# Patient Record
Sex: Female | Born: 1935 | Race: White | Hispanic: No | Smoking: Former smoker
Health system: Southern US, Community
[De-identification: ages and names within clinical notes are randomized; demographics above are authoritative.]

## PROBLEM LIST (undated history)

## (undated) DIAGNOSIS — K5730 Diverticulosis of large intestine without perforation or abscess without bleeding: Secondary | ICD-10-CM

## (undated) DIAGNOSIS — K589 Irritable bowel syndrome without diarrhea: Secondary | ICD-10-CM

## (undated) DIAGNOSIS — I10 Essential (primary) hypertension: Secondary | ICD-10-CM

## (undated) DIAGNOSIS — M1990 Unspecified osteoarthritis, unspecified site: Secondary | ICD-10-CM

## (undated) DIAGNOSIS — E785 Hyperlipidemia, unspecified: Secondary | ICD-10-CM

## (undated) HISTORY — PX: SHOULDER SURGERY: SHX246

## (undated) HISTORY — DX: Hyperlipidemia, unspecified: E78.5

## (undated) HISTORY — PX: BREAST SURGERY: SHX581

## (undated) HISTORY — PX: APPENDECTOMY: SHX54

## (undated) HISTORY — DX: Irritable bowel syndrome, unspecified: K58.9

## (undated) HISTORY — DX: Essential (primary) hypertension: I10

## (undated) HISTORY — PX: TONSILLECTOMY: SUR1361

## (undated) HISTORY — DX: Diverticulosis of large intestine without perforation or abscess without bleeding: K57.30

## (undated) HISTORY — DX: Unspecified osteoarthritis, unspecified site: M19.90

## (undated) HISTORY — PX: FOOT NEUROMA SURGERY: SHX646

## (undated) HISTORY — PX: BLADDER SURGERY: SHX569

## (undated) HISTORY — PX: ABDOMINAL HYSTERECTOMY: SHX81

---

## 1998-02-19 ENCOUNTER — Encounter: Payer: Self-pay | Admitting: Gastroenterology

## 1998-02-26 ENCOUNTER — Ambulatory Visit (HOSPITAL_COMMUNITY): Admission: RE | Admit: 1998-02-26 | Discharge: 1998-02-26 | Payer: Self-pay | Admitting: Gastroenterology

## 1999-12-23 LAB — HM DEXA SCAN: HM Dexa Scan: NORMAL

## 2002-08-24 HISTORY — PX: BREAST CYST EXCISION: SHX579

## 2004-06-28 ENCOUNTER — Ambulatory Visit: Payer: Self-pay | Admitting: Family Medicine

## 2004-08-12 ENCOUNTER — Ambulatory Visit: Payer: Self-pay | Admitting: Family Medicine

## 2004-08-25 ENCOUNTER — Ambulatory Visit: Payer: Self-pay | Admitting: Unknown Physician Specialty

## 2004-10-14 ENCOUNTER — Ambulatory Visit: Payer: Self-pay | Admitting: Unknown Physician Specialty

## 2004-11-24 ENCOUNTER — Ambulatory Visit: Payer: Self-pay | Admitting: Family Medicine

## 2004-12-09 ENCOUNTER — Ambulatory Visit: Payer: Self-pay | Admitting: Family Medicine

## 2005-02-03 ENCOUNTER — Ambulatory Visit: Payer: Self-pay | Admitting: Family Medicine

## 2005-02-17 ENCOUNTER — Ambulatory Visit: Payer: Self-pay | Admitting: Family Medicine

## 2005-04-06 ENCOUNTER — Ambulatory Visit: Payer: Self-pay | Admitting: Family Medicine

## 2005-04-13 ENCOUNTER — Ambulatory Visit: Payer: Self-pay | Admitting: Family Medicine

## 2005-05-19 ENCOUNTER — Ambulatory Visit: Payer: Self-pay | Admitting: Family Medicine

## 2005-07-04 ENCOUNTER — Ambulatory Visit: Payer: Self-pay | Admitting: Rheumatology

## 2005-07-07 ENCOUNTER — Ambulatory Visit: Payer: Self-pay | Admitting: Family Medicine

## 2005-10-13 ENCOUNTER — Other Ambulatory Visit: Payer: Self-pay

## 2005-10-13 ENCOUNTER — Ambulatory Visit: Payer: Self-pay | Admitting: General Practice

## 2005-10-18 ENCOUNTER — Ambulatory Visit: Payer: Self-pay | Admitting: General Practice

## 2005-10-31 ENCOUNTER — Encounter: Payer: Self-pay | Admitting: General Practice

## 2005-11-21 ENCOUNTER — Encounter: Payer: Self-pay | Admitting: General Practice

## 2005-12-22 ENCOUNTER — Encounter: Payer: Self-pay | Admitting: General Practice

## 2005-12-22 ENCOUNTER — Ambulatory Visit: Payer: Self-pay | Admitting: Family Medicine

## 2006-01-25 ENCOUNTER — Ambulatory Visit: Payer: Self-pay | Admitting: Family Medicine

## 2006-02-06 ENCOUNTER — Ambulatory Visit: Payer: Self-pay | Admitting: Family Medicine

## 2006-05-11 ENCOUNTER — Encounter: Payer: Self-pay | Admitting: Family Medicine

## 2006-05-11 ENCOUNTER — Ambulatory Visit: Payer: Self-pay | Admitting: Family Medicine

## 2006-05-11 ENCOUNTER — Other Ambulatory Visit: Admission: RE | Admit: 2006-05-11 | Discharge: 2006-05-11 | Payer: Self-pay | Admitting: Family Medicine

## 2006-06-01 ENCOUNTER — Ambulatory Visit: Payer: Self-pay | Admitting: Unknown Physician Specialty

## 2006-06-22 ENCOUNTER — Ambulatory Visit: Payer: Self-pay | Admitting: Family Medicine

## 2006-07-29 ENCOUNTER — Emergency Department: Payer: Self-pay | Admitting: Emergency Medicine

## 2006-08-14 ENCOUNTER — Ambulatory Visit: Payer: Self-pay | Admitting: Family Medicine

## 2006-08-14 LAB — CONVERTED CEMR LAB
Albumin: 3.5 g/dL (ref 3.5–5.2)
Alkaline Phosphatase: 55 units/L (ref 39–117)
HCT: 35.2 % — ABNORMAL LOW (ref 36.0–46.0)
Hemoglobin: 12.3 g/dL (ref 12.0–15.0)
Lymphocytes Relative: 17.7 % (ref 12.0–46.0)
MCHC: 34.9 g/dL (ref 30.0–36.0)
MCV: 88.3 fL (ref 78.0–100.0)
Monocytes Absolute: 0.9 10*3/uL — ABNORMAL HIGH (ref 0.2–0.7)
Monocytes Relative: 5.1 % (ref 3.0–11.0)
Neutro Abs: 14 10*3/uL — ABNORMAL HIGH (ref 1.4–7.7)
Neutrophils Relative %: 76 % (ref 43.0–77.0)
RDW: 11.9 % (ref 11.5–14.6)
Total Bilirubin: 0.9 mg/dL (ref 0.3–1.2)

## 2006-08-16 ENCOUNTER — Encounter: Payer: Self-pay | Admitting: Family Medicine

## 2006-09-14 ENCOUNTER — Ambulatory Visit: Payer: Self-pay | Admitting: Family Medicine

## 2006-09-14 LAB — CONVERTED CEMR LAB
ALT: 44 units/L — ABNORMAL HIGH (ref 0–40)
AST: 36 units/L (ref 0–37)
Cholesterol: 178 mg/dL (ref 0–200)
HDL: 41.8 mg/dL (ref >39.0)
LDL Cholesterol: 100 mg/dL — ABNORMAL HIGH (ref 0–99)
Total CHOL/HDL Ratio: 4.3
Triglycerides: 179 mg/dL — ABNORMAL HIGH (ref 0–149)
VLDL: 36 mg/dL (ref 0–40)

## 2006-09-17 ENCOUNTER — Ambulatory Visit: Payer: Self-pay | Admitting: Family Medicine

## 2006-09-17 LAB — CONVERTED CEMR LAB
Basophils Absolute: 0 10*3/uL (ref 0.0–0.1)
Eosinophils Absolute: 0.2 10*3/uL (ref 0.0–0.6)
Eosinophils Relative: 2.3 % (ref 0.0–5.0)
HCT: 34.5 % — ABNORMAL LOW (ref 36.0–46.0)
Lymphocytes Relative: 34.6 % (ref 12.0–46.0)
MCHC: 34.1 g/dL (ref 30.0–36.0)
MCV: 89.2 fL (ref 78.0–100.0)
Neutro Abs: 4.8 10*3/uL (ref 1.4–7.7)
Neutrophils Relative %: 58.1 % (ref 43.0–77.0)
RBC: 3.87 M/uL (ref 3.87–5.11)
WBC: 8.2 10*3/uL (ref 4.5–10.5)

## 2007-01-18 ENCOUNTER — Telehealth (INDEPENDENT_AMBULATORY_CARE_PROVIDER_SITE_OTHER): Payer: Self-pay | Admitting: *Deleted

## 2007-01-18 ENCOUNTER — Telehealth: Payer: Self-pay | Admitting: Family Medicine

## 2007-01-22 ENCOUNTER — Telehealth: Payer: Self-pay | Admitting: Family Medicine

## 2007-01-22 ENCOUNTER — Encounter: Payer: Self-pay | Admitting: Family Medicine

## 2007-01-22 DIAGNOSIS — A0472 Enterocolitis due to Clostridium difficile, not specified as recurrent: Secondary | ICD-10-CM

## 2007-01-22 DIAGNOSIS — M519 Unspecified thoracic, thoracolumbar and lumbosacral intervertebral disc disorder: Secondary | ICD-10-CM | POA: Insufficient documentation

## 2007-01-22 DIAGNOSIS — K222 Esophageal obstruction: Secondary | ICD-10-CM

## 2007-01-22 DIAGNOSIS — K589 Irritable bowel syndrome without diarrhea: Secondary | ICD-10-CM | POA: Insufficient documentation

## 2007-01-28 ENCOUNTER — Telehealth: Payer: Self-pay | Admitting: Family Medicine

## 2007-02-01 ENCOUNTER — Ambulatory Visit: Payer: Self-pay | Admitting: Family Medicine

## 2007-02-01 DIAGNOSIS — I10 Essential (primary) hypertension: Secondary | ICD-10-CM

## 2007-02-01 DIAGNOSIS — E7800 Pure hypercholesterolemia, unspecified: Secondary | ICD-10-CM | POA: Insufficient documentation

## 2007-04-05 ENCOUNTER — Ambulatory Visit: Payer: Self-pay | Admitting: Family Medicine

## 2007-04-08 LAB — CONVERTED CEMR LAB
ALT: 17 units/L (ref 0–35)
Basophils Relative: 0.1 % (ref 0.0–1.0)
Eosinophils Relative: 1.7 % (ref 0.0–5.0)
HCT: 36.8 % (ref 36.0–46.0)
Hemoglobin: 12.8 g/dL (ref 12.0–15.0)
LDL Cholesterol: 106 mg/dL — ABNORMAL HIGH (ref 0–99)
Lymphocytes Relative: 33.3 % (ref 12.0–46.0)
Monocytes Absolute: 0.4 10*3/uL (ref 0.2–0.7)
Neutro Abs: 4.4 10*3/uL (ref 1.4–7.7)
Neutrophils Relative %: 59.5 % (ref 43.0–77.0)
RDW: 11.7 % (ref 11.5–14.6)
VLDL: 21 mg/dL (ref 0–40)
WBC: 7.4 10*3/uL (ref 4.5–10.5)

## 2007-07-22 ENCOUNTER — Ambulatory Visit: Payer: Self-pay | Admitting: Family Medicine

## 2007-07-26 ENCOUNTER — Telehealth: Payer: Self-pay | Admitting: Family Medicine

## 2007-07-26 ENCOUNTER — Ambulatory Visit: Payer: Self-pay | Admitting: Family Medicine

## 2007-08-02 ENCOUNTER — Ambulatory Visit: Payer: Self-pay | Admitting: Family Medicine

## 2007-08-05 LAB — CONVERTED CEMR LAB
Basophils Absolute: 0 10*3/uL (ref 0.0–0.1)
HCT: 35.9 % — ABNORMAL LOW (ref 36.0–46.0)
Hemoglobin: 12.3 g/dL (ref 12.0–15.0)
Lymphocytes Relative: 19.5 % (ref 12.0–46.0)
MCHC: 34.2 g/dL (ref 30.0–36.0)
MCV: 92.2 fL (ref 78.0–100.0)
Monocytes Absolute: 0.7 10*3/uL (ref 0.2–0.7)
Neutro Abs: 8.2 10*3/uL — ABNORMAL HIGH (ref 1.4–7.7)
Neutrophils Relative %: 72.6 % (ref 43.0–77.0)
RDW: 11.4 % — ABNORMAL LOW (ref 11.5–14.6)

## 2007-08-09 ENCOUNTER — Ambulatory Visit: Payer: Self-pay | Admitting: Family Medicine

## 2007-09-06 ENCOUNTER — Encounter: Payer: Self-pay | Admitting: Family Medicine

## 2007-09-06 ENCOUNTER — Ambulatory Visit: Payer: Self-pay | Admitting: Family Medicine

## 2007-11-07 ENCOUNTER — Ambulatory Visit: Payer: Self-pay | Admitting: Family Medicine

## 2007-12-02 ENCOUNTER — Telehealth: Payer: Self-pay | Admitting: Family Medicine

## 2008-01-27 ENCOUNTER — Telehealth: Payer: Self-pay | Admitting: Family Medicine

## 2008-01-29 ENCOUNTER — Telehealth: Payer: Self-pay | Admitting: Family Medicine

## 2008-04-24 ENCOUNTER — Ambulatory Visit: Payer: Self-pay | Admitting: Family Medicine

## 2008-04-28 LAB — CONVERTED CEMR LAB
AST: 25 units/L (ref 0–37)
Albumin: 3.9 g/dL (ref 3.5–5.2)
Alkaline Phosphatase: 56 units/L (ref 39–117)
Basophils Relative: 0.2 % (ref 0.0–3.0)
Calcium: 9.2 mg/dL (ref 8.4–10.5)
Creatinine, Ser: 1 mg/dL (ref 0.4–1.2)
Eosinophils Relative: 1.8 % (ref 0.0–5.0)
GFR calc Af Amer: 69 mL/min
GFR calc non Af Amer: 57 mL/min
Hemoglobin: 12.4 g/dL (ref 12.0–15.0)
Lymphocytes Relative: 36.1 % (ref 12.0–46.0)
MCV: 93.3 fL (ref 78.0–100.0)
Neutrophils Relative %: 55 % (ref 43.0–77.0)
Phosphorus: 3.6 mg/dL (ref 2.3–4.6)
RBC: 3.82 M/uL — ABNORMAL LOW (ref 3.87–5.11)
Total Protein: 6.9 g/dL (ref 6.0–8.3)
VLDL: 22 mg/dL (ref 0–40)
WBC: 5.8 10*3/uL (ref 4.5–10.5)

## 2008-05-08 ENCOUNTER — Ambulatory Visit: Payer: Self-pay | Admitting: Family Medicine

## 2008-07-09 ENCOUNTER — Encounter: Payer: Self-pay | Admitting: Family Medicine

## 2008-07-22 ENCOUNTER — Ambulatory Visit: Payer: Self-pay | Admitting: Family Medicine

## 2008-10-08 ENCOUNTER — Telehealth: Payer: Self-pay | Admitting: Family Medicine

## 2008-10-15 ENCOUNTER — Encounter: Payer: Self-pay | Admitting: Family Medicine

## 2008-10-21 ENCOUNTER — Encounter: Payer: Self-pay | Admitting: Family Medicine

## 2008-10-21 ENCOUNTER — Ambulatory Visit: Payer: Self-pay | Admitting: General Surgery

## 2008-10-28 ENCOUNTER — Ambulatory Visit: Payer: Self-pay | Admitting: Family Medicine

## 2008-10-28 ENCOUNTER — Telehealth: Payer: Self-pay | Admitting: Family Medicine

## 2008-10-28 LAB — CONVERTED CEMR LAB
Nitrite: NEGATIVE
RBC / HPF: 0
Specific Gravity, Urine: 1.015
Urine crystals, microscopic: 0 /hpf
Yeast, UA: 0
pH: 5

## 2008-11-05 ENCOUNTER — Encounter: Payer: Self-pay | Admitting: Family Medicine

## 2008-11-06 ENCOUNTER — Encounter: Payer: Self-pay | Admitting: Family Medicine

## 2008-11-19 ENCOUNTER — Ambulatory Visit: Payer: Self-pay | Admitting: Family Medicine

## 2008-11-19 ENCOUNTER — Encounter: Payer: Self-pay | Admitting: Family Medicine

## 2008-11-19 LAB — HM MAMMOGRAPHY: HM Mammogram: NEGATIVE

## 2008-11-23 ENCOUNTER — Encounter (INDEPENDENT_AMBULATORY_CARE_PROVIDER_SITE_OTHER): Payer: Self-pay | Admitting: *Deleted

## 2008-12-10 ENCOUNTER — Ambulatory Visit: Payer: Self-pay | Admitting: Gastroenterology

## 2008-12-10 DIAGNOSIS — R933 Abnormal findings on diagnostic imaging of other parts of digestive tract: Secondary | ICD-10-CM

## 2008-12-21 ENCOUNTER — Encounter: Payer: Self-pay | Admitting: Family Medicine

## 2009-02-17 ENCOUNTER — Encounter: Payer: Self-pay | Admitting: Family Medicine

## 2009-02-24 ENCOUNTER — Telehealth: Payer: Self-pay | Admitting: Family Medicine

## 2009-03-02 ENCOUNTER — Telehealth: Payer: Self-pay | Admitting: Family Medicine

## 2009-03-18 ENCOUNTER — Ambulatory Visit: Payer: Self-pay | Admitting: Unknown Physician Specialty

## 2009-03-18 ENCOUNTER — Encounter: Payer: Self-pay | Admitting: Family Medicine

## 2009-03-18 LAB — HM COLONOSCOPY

## 2009-04-24 ENCOUNTER — Encounter: Payer: Self-pay | Admitting: Family Medicine

## 2009-09-14 ENCOUNTER — Ambulatory Visit: Payer: Self-pay | Admitting: Family Medicine

## 2009-09-22 ENCOUNTER — Telehealth: Payer: Self-pay | Admitting: Family Medicine

## 2009-09-24 ENCOUNTER — Ambulatory Visit: Payer: Self-pay | Admitting: Family Medicine

## 2009-09-24 ENCOUNTER — Telehealth: Payer: Self-pay | Admitting: Family Medicine

## 2009-09-29 ENCOUNTER — Ambulatory Visit: Payer: Self-pay | Admitting: Family Medicine

## 2009-09-29 DIAGNOSIS — M545 Low back pain: Secondary | ICD-10-CM

## 2009-10-01 ENCOUNTER — Encounter: Payer: Self-pay | Admitting: Family Medicine

## 2009-10-01 ENCOUNTER — Ambulatory Visit: Payer: Self-pay | Admitting: Family Medicine

## 2009-10-04 ENCOUNTER — Telehealth: Payer: Self-pay | Admitting: Family Medicine

## 2009-10-05 ENCOUNTER — Emergency Department: Payer: Self-pay | Admitting: Emergency Medicine

## 2009-11-08 ENCOUNTER — Telehealth: Payer: Self-pay | Admitting: Family Medicine

## 2009-11-23 ENCOUNTER — Telehealth: Payer: Self-pay | Admitting: Family Medicine

## 2010-03-18 ENCOUNTER — Telehealth: Payer: Self-pay | Admitting: Family Medicine

## 2010-04-26 ENCOUNTER — Telehealth: Payer: Self-pay | Admitting: Family Medicine

## 2010-04-27 ENCOUNTER — Ambulatory Visit: Payer: Self-pay | Admitting: Family Medicine

## 2010-05-30 ENCOUNTER — Telehealth: Payer: Self-pay | Admitting: Family Medicine

## 2010-07-15 ENCOUNTER — Telehealth: Payer: Self-pay | Admitting: Family Medicine

## 2010-08-20 ENCOUNTER — Ambulatory Visit
Admission: RE | Admit: 2010-08-20 | Discharge: 2010-08-20 | Payer: Self-pay | Source: Home / Self Care | Attending: Family Medicine | Admitting: Family Medicine

## 2010-08-20 DIAGNOSIS — J019 Acute sinusitis, unspecified: Secondary | ICD-10-CM | POA: Insufficient documentation

## 2010-08-25 NOTE — Assessment & Plan Note (Signed)
Summary: FLU VACCINE/ TOWER  Nurse Visit   Allergies: 1)  ! Zithromax 2)  ! Levaquin 3)  ! Biaxin 4)  Norvasc (Amlodipine Besylate) 5)  Feldene (Piroxicam) 6)  Lipitor (Atorvastatin Calcium) 7)  Advil (Ibuprofen)  Orders Added: 1)  Flu Vaccine 71yrs + MEDICARE PATIENTS [Q2039] 2)  Administration Flu vaccine - MCR [G0008]    Flu Vaccine Consent Questions     Do you have a history of severe allergic reactions to this vaccine? no    Any prior history of allergic reactions to egg and/or gelatin? no    Do you have a sensitivity to the preservative Thimersol? no    Do you have a past history of Guillan-Barre Syndrome? no    Do you currently have an acute febrile illness? no    Have you ever had a severe reaction to latex? no    Vaccine information given and explained to patient? yes    Are you currently pregnant? no    Lot Number:[id number]   Exp Date:01/21/2011   Site Given  Left Deltoid IMlu

## 2010-08-25 NOTE — Progress Notes (Signed)
Summary: does pt need pneumovax?  Phone Note Call from Patient Call back at Home Phone [phone]   Caller: Patient Call For: Judith Part MD Summary of Call: Pt was at [facility] in march with pneumonia.  She is asking if you think she should get another pneumovax, she had one in 2002. Initial call taken by: Lowella Petties CMA,  April 26, 2010 8:16 AM  Follow-up for Phone Call        the general guidelines are that after age 75 the last pneumovax is the LAST one --since she got it at approx age 40 we would not give it  Follow-up by: Judith Part MD,  April 26, 2010 9:28 AM  Additional Follow-up for Phone Call Additional follow up Details #1::        Patient notified as instructed by telephone. Lewanda Rife LPN  April 26, 2010 10:54 AM

## 2010-08-25 NOTE — Progress Notes (Signed)
Summary: Hydrocodone/APAP 5-500  Phone Note Refill Request Call back at [phone] Message from:  [facility] [address] on July 15, 2010 4:21 PM  Refills Requested: Medication #1:  VICODIN 5-500 MG TABS 1 by mouth up to every 4-6 hours as needed back pain  watch for sedation.   Last Refilled: 05/30/2010 [facility] [address] electronically requested refill for Hydrocodone/APAP 5-500 Last refill date 05/30/10. Please advise.    Method Requested: Telephone to Pharmacy Initial call taken by: Lewanda Rife LPN,  July 15, 2010 4:22 PM  Follow-up for Phone Call        px written on EMR for call in  Follow-up by: Judith Part MD,  July 15, 2010 4:45 PM  Additional Follow-up for Phone Call Additional follow up Details #1::        Medication phoned to [facility] [address]  pharmacy as instructed. Lewanda Rife LPN  July 15, 2010 4:53 PM     Prescriptions: VICODIN 5-500 MG TABS (HYDROCODONE-ACETAMINOPHEN) 1 by mouth up to every 4-6 hours as needed back pain  watch for sedation  #30 x 0   Entered and Authorized by:   Judith Part MD   Signed by:   Lewanda Rife LPN on 78/46/9629   Method used:   Telephoned to ...         RxID:   [id number]

## 2010-08-25 NOTE — Progress Notes (Signed)
Summary: Pt update  Phone Note Call from Patient   Caller: Patient Summary of Call: Patient calling to get the xray results from last Friday when she went to [facility] on 10/01/2009 , she is still hurting but cant take pain meds too good. she is at her work right now. You can call her with results at [phone], she goes by Rachel Knox at her job.  Initial call taken by: Carlton Adam,  October 04, 2009 2:43 PM  Follow-up for Phone Call        let her know there is some mild degenerative change in spine and disk - no fractures  I want to refer her to ortho since she is hurting so much let me know if agreeable and I will do ref  Follow-up by: Judith Part MD,  October 04, 2009 5:11 PM  Additional Follow-up for Phone Call Additional follow up Details #1::        Patient notified as instructed by telephone. Pt said she is not hurting as much today. Let her think about the ortho referral and she will let you know.Lewanda Rife LPN  October 04, 2009 5:28 PM

## 2010-08-25 NOTE — Progress Notes (Signed)
Summary: Rx Vicodin  Phone Note Refill Request Call back at [phone] [facility] from:  [facility]  Refills Requested: Medication #1:  VICODIN 5-500 MG TABS 1 by mouth up to every 4-6 hours as needed back pain  watch for sedation.   Last Refilled: 09/30/2009  Method Requested: Telephone to Pharmacy Initial call taken by: Sydell Axon LPN,  May 30, 2010 9:54 AM  Follow-up for Phone Call        px written on EMR for call in  Follow-up by: Judith Part MD,  May 30, 2010 10:18 AM  Additional Follow-up for Phone Call Additional follow up Details #1::        Medication phoned to [facility] pharmacy as instructed. Lewanda Rife LPN  May 30, 2010 10:41 AM     New/Updated Medications: VICODIN 5-500 MG TABS (HYDROCODONE-ACETAMINOPHEN) 1 by mouth up to every 4-6 hours as needed back pain  watch for sedation Prescriptions: VICODIN 5-500 MG TABS (HYDROCODONE-ACETAMINOPHEN) 1 by mouth up to every 4-6 hours as needed back pain  watch for sedation  #30 x 0   Entered and Authorized by:   Judith Part MD   Signed by:   Lewanda Rife LPN on 45/40/9811   Method used:   Telephoned to ...         RxID:   [id number]

## 2010-08-25 NOTE — Progress Notes (Signed)
Summary: pt needs written scripts for mail order  Phone Note Refill Request Message from:  Patient  Refills Requested: Medication #1:  HYDROCHLOROTHIAZIDE 25 MG  TABS 1 by mouth once daily  Medication #2:  DIOVAN HCT 160-12.5 MG  TABS 1 by mouth once daily  Medication #3:  METOPROLOL SUCCINATE 50 MG XR24H-TAB 1 by mouth once daily  Medication #4:  OMEPRAZOLE 20 MG TBEC Take 1 tablet by mouth once a day Also allegra.  Pt wants written scripts to send to mail order, please call when ready.  Initial call taken by: Lowella Petties CMA,  November 08, 2009 2:22 PM  Follow-up for Phone Call        ok to refill all of these, 90 days with 3 refills. Follow-up by: Ruthe Mannan MD,  November 08, 2009 2:27 PM    Prescriptions: METOPROLOL SUCCINATE 50 MG XR24H-TAB (METOPROLOL SUCCINATE) 1 by mouth once daily  #90 x 3   Entered by:   Delilah Shan CMA (AAMA)   Authorized by:   Ruthe Mannan MD   Signed by:   Delilah Shan CMA (AAMA) on 11/08/2009   Method used:   Electronically to        [facility]* (mail-order)       [address]       [place], [place]  [address]       Ph: [phone]       Fax: [phone]   RxID:   [id number] OMEPRAZOLE 20 MG TBEC (OMEPRAZOLE) Take 1 tablet by mouth once a day  #90 x 3   Entered by:   Delilah Shan CMA (AAMA)   Authorized by:   Ruthe Mannan MD   Signed by:   Delilah Shan CMA (AAMA) on 11/08/2009   Method used:   Electronically to        [facility] (mail-order)       [address]       [place], [place]  [address]       Ph: [phone]       Fax: [phone]   RxID:   [id number] ALLEGRA 180 MG  TABS (FEXOFENADINE HCL) 1 by mouth once daily as needed  #90 x 3   Entered by:   Delilah Shan CMA (AAMA)   Authorized by:   Ruthe Mannan MD   Signed by:   Delilah Shan CMA (AAMA) on 11/08/2009   Method used:   Electronically to        [facility] (mail-order)       [address]  [place], [place]  [address]       Ph: [phone]       Fax: [phone]   RxID:   [id number] HYDROCHLOROTHIAZIDE 25 MG  TABS (HYDROCHLOROTHIAZIDE) 1 by mouth once daily  #90 x 3   Entered by:   Delilah Shan CMA (AAMA)   Authorized by:   Ruthe Mannan MD   Signed by:   Delilah Shan CMA (AAMA) on 11/08/2009   Method used:   Electronically to        [facility]* (mail-order)       [address]       [place], [place]  [address]       Ph: [phone]       Fax: [phone]   RxID:   [id number] DIOVAN HCT 160-12.5 MG  TABS (VALSARTAN-HYDROCHLOROTHIAZIDE) 1 by mouth once daily  #90 x 3   Entered by:  Lugene Fuquay CMA (AAMA)   Authorized by:   Ruthe Mannan MD   Signed by:   Delilah Shan CMA (AAMA) on 11/08/2009   Method used:   Electronically to        [facility]* (mail-order)       [address]       [place], [place]  [address]       Ph: [phone]       Fax: [phone]   RxID:   [id number]

## 2010-08-25 NOTE — Miscellaneous (Signed)
[facility]   Imported By: Lanelle Bal 09/17/2009 09:06:27  _____________________________________________________________________  External Attachment:    Type:   Image     Comment:   External Document

## 2010-08-25 NOTE — Assessment & Plan Note (Signed)
Summary: MID BACK PAIN   Vital Signs:  Patient profile:   77 year old female Height:      64 inches Weight:      133 pounds BMI:     22.91 Temp:     98.1 degrees F oral Pulse rate:   72 / minute Pulse rhythm:   regular BP sitting:   110 / 70  (left arm) Cuff size:   regular  Vitals Entered By: Lewanda Rife LPN (September 30, 5619 3:59 PM)  History of Present Illness: still having cough but not as frequent   is not tolerating the abx -well - it gives her diarrhea -- has really bad  has 3 more days -- biaxin  has been on 7 days - can just stop   back is really hurting  between middle and low back -- a burning pain is just in the middle - not worse on one side or the other  has had for a few years -- told she has deg disc dz   pain is not radiating to her legs or arms  no numbness or weakness  is more painful to bend foward than back  twisting does not hurt  it helps a bit to stand up very straight   took her brother's percocet - and it really helped  has been taking some tylenol and that is not helping much  no nsaids -- cannot take advil   flexeril helps her sleep but not much with pain   Allergies: 1)  ! Zithromax 2)  ! Levaquin 3)  ! Biaxin 4)  Norvasc (Amlodipine Besylate) 5)  Feldene (Piroxicam) 6)  Lipitor (Atorvastatin Calcium) 7)  Advil (Ibuprofen)  Past History:  Past Medical History: Last updated: 05/08/2008 Diverticulosis, colon Hyperlipidemia Hypertension Osteoarthritis Urinary incontinence IBS hx of c difficile in past - treated   Past Surgical History: Last updated: 03/31/2009 Appendectomy- with infection (surgery times 2) Hysterectomy Tonsillectomy Bladder tack Breast cyst needle biopsy Left foot surgery- morton's neuroma Dexa- normal (12/1999) EGD- dilated esoph stricture (1997) EGD- dilated stricture, gastritis (08/2002) Breast discharge, cyst removed (08/2002) Colonoscopy- diverticulosis, hemorrhoids (1999) Colonoscopy  (09/2004) Shoulder surgery- spur EGD, BE- neg (1997, 1998) CT abd/ pelvis (12/1996) CT abd- neg (05/2006), small hiatal hernia 9/10 colonoscopy diverticulosis   Family History: Last updated: Feb 04, 2007 Father: deceased age 61- MI Mother: deceased age 27- kidney cancer Siblings: 1 brother, 3 sisters sister MI, lung cancer  Social History: Last updated: 12/10/2008 Marital Status:  Children:  Occupation:  remote hx of brief light smoking hx of second hand smoke exp Alcohol Use - no Daily Caffeine Use 1 per day Illicit Drug Use - no Patient gets regular exercise.  Risk Factors: Exercise: yes (12/10/2008)  Risk Factors: Smoking Status: never (01/22/2007)  Review of Systems General:  Denies fatigue, fever, loss of appetite, and malaise. Eyes:  Denies blurring. ENT:  Complains of postnasal drainage. CV:  Denies chest pain or discomfort and palpitations. Resp:  Complains of cough; denies shortness of breath, sputum productive, and wheezing. GI:  Complains of diarrhea; denies nausea and vomiting. GU:  Denies dysuria, hematuria, and urinary frequency. MS:  Complains of low back pain, mid back pain, and stiffness; denies cramps and muscle weakness. Derm:  Denies lesion(s), poor wound healing, and rash. Neuro:  Denies numbness, tingling, and weakness. Endo:  Denies excessive thirst and excessive urination. Heme:  Denies abnormal bruising and bleeding.  Physical Exam  General:  Well-developed,well-nourished,in no acute distress; alert,appropriate and cooperative  throughout examination Head:  normocephalic, atraumatic, and no abnormalities observed.   Eyes:  vision grossly intact, pupils equal, pupils round, and pupils reactive to light.  no conjunctival pallor, injection or icterus  Mouth:  pharynx pink and moist.   Neck:  nl rom no bony tendernes no LN Chest Wall:  No deformities, masses, or tenderness noted. Lungs:  CTA with harsh bs at bases no rales or rhonchi  Heart:   Normal rate and regular rhythm. S1 and S2 normal without gallop, murmur, click, rub or other extra sounds. Abdomen:  Bowel sounds positive,abdomen soft and non-tender without masses, organomegaly or hernias noted. Msk:  tender lower TS and upper LS  pain in perispinal musculature bilat - but not SI joints  flex 90 deg with pain and ext 20 deg (helps a bit)  some pain on L flex nl twist  nl slr  nl rom hips nl gait  Pulses:  R and L carotid,radial,femoral,dorsalis pedis and posterior tibial pulses are full and equal bilaterally Extremities:  No clubbing, cyanosis, edema, or deformity noted with normal full range of motion of all joints.   Neurologic:  strength normal in all extremities, sensation intact to light touch, gait normal, and DTRs symmetrical and normal.   Skin:  Intact without suspicious lesions or rashes Cervical Nodes:  No lymphadenopathy noted Inguinal Nodes:  No significant adenopathy Psych:  normal affect, talkative and pleasant    Impression & Recommendations:  Problem # 1:  BACK PAIN (ICD-724.5) Assessment New thoracolumbar- positional with some spinal tenderness but no neurol s/s check x ray vidocin for pain since cannot take nsaids  plan based on film result  Her updated medication list for this problem includes:    Flexeril 10 Mg Tabs (Cyclobenzaprine hcl) .Marland Kitchen... Take 1 tablet by mouth two times a day as needed    Vicodin 5-500 Mg Tabs (Hydrocodone-acetaminophen) .Marland Kitchen... 1 by mouth up to every 4-6 hours as needed back pain  watch for sedation  Orders: Radiology Referral (Radiology)  Problem # 2:  BRONCHITIS- ACUTE (ICD-466.0) Assessment: Improved gradual imp will not finish biaxin due to diarrhea and fact she is much imp  adv to please update if symptoms return or do not imp  hx of c diff- so esp update if diarrhea does not imp further  The following medications were removed from the medication list:    Biaxin Xl 500 Mg Xr24h-tab (Clarithromycin) .Marland Kitchen... 2 by  mouth once daily for 10 days  take with a meal Her updated medication list for this problem includes:    Hydrocodone-homatropine 5-1.5 Mg/4ml Syrp (Hydrocodone-homatropine) .Marland Kitchen... 1 teapoon by mouth up to every 6 hours as needed severe cough  Complete Medication List: 1)  Flexeril 10 Mg Tabs (Cyclobenzaprine hcl) .... Take 1 tablet by mouth two times a day as needed 2)  Diovan Hct 160-12.5 Mg Tabs (Valsartan-hydrochlorothiazide) .Marland Kitchen.. 1 by mouth once daily 3)  Hydrochlorothiazide 25 Mg Tabs (Hydrochlorothiazide) .Marland Kitchen.. 1 by mouth once daily 4)  Zocor 20 Mg Tabs (Simvastatin) .... Take 1 tablet by mouth once a day 5)  Allegra 180 Mg Tabs (Fexofenadine hcl) .Marland Kitchen.. 1 by mouth once daily as needed 6)  Omeprazole 20 Mg Tbec (Omeprazole) .... Take 1 tablet by mouth once a day 7)  Metoprolol Succinate 50 Mg Xr24h-tab (Metoprolol succinate) .Marland Kitchen.. 1 by mouth once daily 8)  Hydrocodone-homatropine 5-1.5 Mg/45ml Syrp (Hydrocodone-homatropine) .Marland Kitchen.. 1 teapoon by mouth up to every 6 hours as needed severe cough 9)  Vicodin 5-500 Mg Tabs (  Hydrocodone-acetaminophen) .Marland Kitchen.. 1 by mouth up to every 4-6 hours as needed back pain  watch for sedation  Patient Instructions: 1)  try some heat on your back - or warm bath  2)  we will set up x rays at check out  3)  try the vicodin for pain -- do not mix with the flexeril - it is sedating  4)  I will update you with a plan when x rays return Prescriptions: VICODIN 5-500 MG TABS (HYDROCODONE-ACETAMINOPHEN) 1 by mouth up to every 4-6 hours as needed back pain  watch for sedation  #30 x 0   Entered and Authorized by:   Judith Part MD   Signed by:   Judith Part MD on 09/29/2009   Method used:   Print then Give to Patient   RxID:   [id number]   Current Allergies (reviewed today): ! ZITHROMAX ! LEVAQUIN ! BIAXIN NORVASC (AMLODIPINE BESYLATE) FELDENE (PIROXICAM) LIPITOR (ATORVASTATIN CALCIUM) ADVIL (IBUPROFEN)

## 2010-08-25 NOTE — Progress Notes (Signed)
Summary: Cyclobenzaprine 10mg  rx  Phone Note Refill Request Call back at [phone] Message from:  [facility] [address] on March 18, 2010 1:01 PM  Refills Requested: Medication #1:  FLEXERIL 10 MG TABS Take 1 tablet by mouth two times a day as needed   Last Refilled: 02/20/2010 [facility] [address] electronically request refill for Cyclobenzaprine 10mg  taking 1/2 to 1 by mouth three times daily as needed. Medlist list one tablet by mouth twice a day as needed.Please advise.    Method Requested: Telephone to Pharmacy Initial call taken by: Lewanda Rife LPN,  March 18, 2010 1:02 PM  Follow-up for Phone Call        px written on EMR for call in  Follow-up by: Judith Part MD,  March 18, 2010 1:17 PM    New/Updated Medications: FLEXERIL 10 MG TABS (CYCLOBENZAPRINE HCL) Take 1/2 to 1 by mouth up to three times a day as needed Prescriptions: FLEXERIL 10 MG TABS (CYCLOBENZAPRINE HCL) Take 1/2 to 1 by mouth up to three times a day as needed  #30 x 1   Entered by:   Linde Gillis CMA (AAMA)   Authorized by:   Judith Part MD   Signed by:   Linde Gillis CMA (AAMA) on 03/18/2010   Method used:   [facility]ically to        [facility] [address])       [address], [address]       [place], [place]  [address]       Ph: [phone]       Fax: [phone]   RxID:   [id number]

## 2010-08-25 NOTE — Assessment & Plan Note (Signed)
Summary: COUGH CONGESTED PER DR TOWER/RI   Vital Signs:  Patient profile:   77 year old female Height:      64 inches Weight:      130.75 pounds BMI:     22.52 O2 Sat:      93 % on Room air Temp:     97.7 degrees F oral Pulse rate:   80 / minute Pulse rhythm:   regular Resp:     28 per minute BP sitting:   124 / 60  (left arm) Cuff size:   regular  Vitals Entered By: Lewanda Rife LPN (September 25, 1322 8:59 AM)  O2 Flow:  Room air  History of Present Illness: never got better from last uri -- was tx with augmentin  dry cough now -- is rattly - but not bringing anything up  is white if she gets a little up   is wheeze at night just a little   fever is on and off -- is ? how high gets dry lips  some chils and aches  some runny and stuffy nose  no headache       Allergies: 1)  ! Zithromax 2)  ! Levaquin 3)  Norvasc (Amlodipine Besylate) 4)  Feldene (Piroxicam) 5)  Lipitor (Atorvastatin Calcium) 6)  Advil (Ibuprofen)  Past History:  Past Medical History: Last updated: 05/08/2008 Diverticulosis, colon Hyperlipidemia Hypertension Osteoarthritis Urinary incontinence IBS hx of c difficile in past - treated   Past Surgical History: Last updated: 03/31/2009 Appendectomy- with infection (surgery times 2) Hysterectomy Tonsillectomy Bladder tack Breast cyst needle biopsy Left foot surgery- morton's neuroma Dexa- normal (12/1999) EGD- dilated esoph stricture (1997) EGD- dilated stricture, gastritis (08/2002) Breast discharge, cyst removed (08/2002) Colonoscopy- diverticulosis, hemorrhoids (1999) Colonoscopy (09/2004) Shoulder surgery- spur EGD, BE- neg (1997, 1998) CT abd/ pelvis (12/1996) CT abd- neg (05/2006), small hiatal hernia 9/10 colonoscopy diverticulosis   Family History: Last updated: 02-08-07 Father: deceased age 73- MI Mother: deceased age 53- kidney cancer Siblings: 1 brother, 3 sisters sister MI, lung cancer  Social History: Last  updated: 12/10/2008 Marital Status:  Children:  Occupation:  remote hx of brief light smoking hx of second hand smoke exp Alcohol Use - no Daily Caffeine Use 1 per day Illicit Drug Use - no Patient gets regular exercise.  Risk Factors: Exercise: yes (12/10/2008)  Risk Factors: Smoking Status: never (01/22/2007)  Review of Systems General:  Complains of chills, fatigue, fever, loss of appetite, and malaise. Eyes:  Denies blurring, discharge, and eye irritation. ENT:  Complains of nasal congestion, postnasal drainage, and sore throat; denies earache. CV:  Denies chest pain or discomfort and palpitations. Resp:  Complains of cough and sputum productive; denies pleuritic and shortness of breath. GI:  Denies abdominal pain, diarrhea, nausea, and vomiting. Derm:  Denies rash.  Physical Exam  General:  Well-developed,well-nourished,in no acute distress; alert,appropriate and cooperative throughout examination Head:  normocephalic, atraumatic, and no abnormalities observed.  no sinus tenderness  Eyes:  vision grossly intact, pupils equal, pupils round, pupils reactive to light, and no injection.   Ears:  R ear normal and L ear normal.   Nose:  nares are boggy with clear rhinorrhea  Mouth:  pharynx pink and moist, no erythema, and no exudates.   Neck:  No deformities, masses, or tenderness noted. Lungs:  harsh bs at bases with fair air exch and some rhonchi no wheeze/ or rales or crackles cough sounds moist- however no sob Heart:  Normal rate and regular  rhythm. S1 and S2 normal without gallop, murmur, click, rub or other extra sounds. Abdomen:  soft and non-tender.   Skin:  Intact without suspicious lesions or rashes Cervical Nodes:  No lymphadenopathy noted Psych:  normal affect, talkative and pleasant    Impression & Recommendations:  Problem # 1:  BRONCHITIS- ACUTE (ICD-466.0) Assessment Deteriorated not improved after course of abx although no rales heard on exam- I' m  worried about poss pneumonia sent for cxr now to call rep  cough med with hydrocodone with caution / fluids/rest andwill update The following medications were removed from the medication list:    Augmentin 875-125 Mg Tabs (Amoxicillin-pot clavulanate) .Marland Kitchen... 1 by mouth two times a day with food for bronchitis for 10 days    Tessalon Perles 100 Mg Caps (Benzonatate) .Marland Kitchen... 1 by mouth up to three times a day as needed for cough swallow whole Her updated medication list for this problem includes:    Hydrocodone-homatropine 5-1.5 Mg/67ml Syrp (Hydrocodone-homatropine) .Marland Kitchen... 1 teapoon by mouth up to every 6 hours as needed severe cough    Biaxin Xl 500 Mg Xr24h-tab (Clarithromycin) .Marland Kitchen... 2 by mouth once daily for 10 days  take with a meal  Orders: Radiology Referral (Radiology)  Complete Medication List: 1)  Flexeril 10 Mg Tabs (Cyclobenzaprine hcl) .... Take 1 tablet by mouth two times a day as needed 2)  Diovan Hct 160-12.5 Mg Tabs (Valsartan-hydrochlorothiazide) .Marland Kitchen.. 1 by mouth once daily 3)  Hydrochlorothiazide 25 Mg Tabs (Hydrochlorothiazide) .Marland Kitchen.. 1 by mouth once daily 4)  Zocor 20 Mg Tabs (Simvastatin) .... Take 1 tablet by mouth once a day 5)  Allegra 180 Mg Tabs (Fexofenadine hcl) .Marland Kitchen.. 1 by mouth once daily as needed 6)  Omeprazole 20 Mg Tbec (Omeprazole) .... Take 1 tablet by mouth once a day 7)  Metoprolol Succinate 50 Mg Xr24h-tab (Metoprolol succinate) .Marland Kitchen.. 1 by mouth once daily 8)  Hydrocodone-homatropine 5-1.5 Mg/18ml Syrp (Hydrocodone-homatropine) .Marland Kitchen.. 1 teapoon by mouth up to every 6 hours as needed severe cough 9)  Biaxin Xl 500 Mg Xr24h-tab (Clarithromycin) .... 2 by mouth once daily for 10 days  take with a meal  Patient Instructions: 1)  drink lots of fluids 2)  take cough medicine with caution - as it can sedate  3)  we will send you for chest x ray at check out  Prescriptions: BIAXIN XL 500 MG XR24H-TAB (CLARITHROMYCIN) 2 by mouth once daily for 10 days  take with a meal   #20 x 0   Entered and Authorized by:   Judith Part MD   Signed by:   Judith Part MD on 09/26/2009   Method used:   Telephoned to ...       [facility] [address] (retail)       [address]       [place], [place]  [address]       Ph: [phone]       Fax: [phone]   RxID:   [id number] HYDROCODONE-HOMATROPINE 5-1.5 MG/5ML SYRP (HYDROCODONE-HOMATROPINE) 1 teapoon by mouth up to every 6 hours as needed severe cough  #120cc x 0   Entered and Authorized by:   Judith Part MD   Signed by:   Judith Part MD on 09/24/2009   Method used:   Print then Give to Patient   RxID:   [id number]   Current Allergies (reviewed today): ! ZITHROMAX ! LEVAQUIN NORVASC (  AMLODIPINE BESYLATE) FELDENE (PIROXICAM) LIPITOR (ATORVASTATIN CALCIUM) ADVIL (IBUPROFEN)   Above Rx's called to [facility] pharmacy per patient's request, patient notified of chest xray results as instructed.  She will see how she feels then call back to schedule 1-2 week follow up.  Linde Gillis CMA Duncan Dull)  September 24, 2009 3:15 PM

## 2010-08-25 NOTE — Progress Notes (Signed)
Summary: called report 2 view chest xray  Phone Note From Other Clinic Call back at [phone] [facility] ext   Caller: Elon Jester at [place]  Call For: Dr Milinda Antis Summary of Call: Two View Chest called report. Prominence of interstitual marking may represent a componite of pulmonary fibrosis thru interstitual infiltrate e.g. bronchitis cannot be excluded.  Does not appear to be secondary signs suggestive of the sequela of pulmonary edema. Transcriptionist will fax report. Pt left because did not feel well. Initial call taken by: Lewanda Rife LPN,  September 24, 2009 1:02 PM  Follow-up for Phone Call        please let her know that cxr supports bronchitis- no pneumonia seen  I want to change her abx --px written on EMR for call in for biaxin  f/u with me in 1-2 wk re check lungs and further rev chest x ray report- thanks update if not improved  seek care over weekend if worse or sob  Follow-up by: Judith Part MD,  September 24, 2009 2:48 PM  Additional Follow-up for Phone Call Additional follow up Details #1::        Patient advised as instructed.  Rx for Biaxin called to [facility] [address] per patient's request.   Additional Follow-up by: Linde Gillis CMA Duncan Dull),  September 24, 2009 3:16 PM

## 2010-08-25 NOTE — Progress Notes (Signed)
Summary: cough,congestion  Phone Note Call from Patient Call back at [facility] Phone [phone]   Summary of Call: Patient complains of cough and congestion. Says that she runs low grade fever during the night. Has not checked temp, can just tell when she has one. Says that she just doesn't feel good. Wanted to see you tomorrow, but there are no app available w/ you. Offered another dr., but she denied and said that you had told her to call you if not better since app in feb. Uses [facility] [address]. Please advise.  Initial call taken by: Melody Comas,  September 22, 2009 3:45 PM  Follow-up for Phone Call        I advise you put her on the schedule with me on friday as an acute visit-- she can cancel if she feels better  if sob or high fever after hours - go toER or urgent care  please ask if she got better after the augmentin tx on 2/22 and then got worse or if she never got better at all- thanks  Follow-up by: Judith Part MD,  September 22, 2009 5:08 PM  Additional Follow-up for Phone Call Additional follow up Details #1::        Patient notified as instructed by telephone. Pt scheduled appt with Dr Milinda Antis 09/24/09 at [facility] LPN  September 23, 979 5:51 PM      Appended Document: cough,congestion the Augmentin did not help pt at all she never got better and developed diarrhea.

## 2010-08-25 NOTE — Assessment & Plan Note (Signed)
Summary: CONGESTION   Vital Signs:  Patient profile:   77 year old female Height:      64 inches Weight:      136.50 pounds BMI:     23.51 Temp:     98.4 degrees F oral Pulse rate:   84 / minute Pulse rhythm:   regular BP sitting:   132 / 46  (left arm) Cuff size:   regular  Vitals Entered By: Lewanda Rife LPN (September 14, 2009 2:22 PM) CC: congestion in chest, achy and dizzy   History of Present Illness: starting getting sick on friday  chills and aches  slept all day saturday  then developed chest rattling and congestion and some diarrhea  is achey and dizzy   has taken a med with mucinex D -- does not make her nervous   not a lot of nasal congestion  mostly in her throat and chest rattle in her chest  used honey for cough this am - this helped   ? had a fever and her lips peeled   no ear or throat pain   did have her flu shot this year   Allergies: 1)  ! Zithromax 2)  ! Levaquin 3)  Norvasc (Amlodipine Besylate) 4)  Feldene (Piroxicam) 5)  Lipitor (Atorvastatin Calcium) 6)  Advil (Ibuprofen)  Past History:  Past Medical History: Last updated: 05/08/2008 Diverticulosis, colon Hyperlipidemia Hypertension Osteoarthritis Urinary incontinence IBS hx of c difficile in past - treated   Past Surgical History: Last updated: 03/31/2009 Appendectomy- with infection (surgery times 2) Hysterectomy Tonsillectomy Bladder tack Breast cyst needle biopsy Left foot surgery- morton's neuroma Dexa- normal (12/1999) EGD- dilated esoph stricture (1997) EGD- dilated stricture, gastritis (08/2002) Breast discharge, cyst removed (08/2002) Colonoscopy- diverticulosis, hemorrhoids (1999) Colonoscopy (09/2004) Shoulder surgery- spur EGD, BE- neg (1997, 1998) CT abd/ pelvis (12/1996) CT abd- neg (05/2006), small hiatal hernia 9/10 colonoscopy diverticulosis   Family History: Last updated: 2007/02/23 Father: deceased age 75- MI Mother: deceased age 67- kidney  cancer Siblings: 1 brother, 3 sisters sister MI, lung cancer  Social History: Last updated: 12/10/2008 Marital Status:  Children:  Occupation:  remote hx of brief light smoking hx of second hand smoke exp Alcohol Use - no Daily Caffeine Use 1 per day Illicit Drug Use - no Patient gets regular exercise.  Risk Factors: Exercise: yes (12/10/2008)  Risk Factors: Smoking Status: never (01/22/2007)  Review of Systems General:  Complains of chills, fatigue, fever, and malaise. Eyes:  Denies discharge and eye irritation. ENT:  Complains of nasal congestion; denies hoarseness, postnasal drainage, and sinus pressure. CV:  Denies chest pain or discomfort and palpitations. Resp:  Complains of cough, sputum productive, and wheezing; denies pleuritic and shortness of breath. GI:  Denies abdominal pain, bloody stools, and change in bowel habits. Derm:  Denies lesion(s), poor wound healing, and rash. Neuro:  Denies numbness and tingling. Heme:  Denies abnormal bruising and bleeding.  Physical Exam  General:  Well-developed,well-nourished,in no acute distress; alert,appropriate and cooperative throughout examination Head:  normocephalic, atraumatic, and no abnormalities observed.  no sinus tenderness Eyes:  vision grossly intact, pupils equal, pupils round, pupils reactive to light, and no injection.   Ears:  R ear normal and L ear normal.   Nose:  nares are boggy Mouth:  pharynx pink and moist, no erythema, and no exudates.   Neck:  supple with full rom and no masses or thyromegally, no JVD or carotid bruit  Chest Wall:  No deformities, masses, or tenderness  noted. Lungs:  CTA with harsh bs at bases - some mild rhonchi and no rales  no wheeze Heart:  Normal rate and regular rhythm. S1 and S2 normal without gallop, murmur, click, rub or other extra sounds. Skin:  Intact without suspicious lesions or rashes Cervical Nodes:  No lymphadenopathy noted Psych:  normal affect, talkative and  pleasant    Impression & Recommendations:  Problem # 1:  BRONCHITIS- ACUTE (ICD-466.0) Assessment New with cough and congestion and low grade fever cover with augmentin and update expectorant as needed  tessalon as needed for cough  pt advised to update me if symptoms worsen or do not improve - esp worse cough or any sob Her updated medication list for this problem includes:    Augmentin 875-125 Mg Tabs (Amoxicillin-pot clavulanate) .Marland Kitchen... 1 by mouth two times a day with food for bronchitis for 10 days    Tessalon Perles 100 Mg Caps (Benzonatate) .Marland Kitchen... 1 by mouth up to three times a day as needed for cough swallow whole  Complete Medication List: 1)  Flexeril 10 Mg Tabs (Cyclobenzaprine hcl) .... Take 1 tablet by mouth two times a day as needed 2)  Diovan Hct 160-12.5 Mg Tabs (Valsartan-hydrochlorothiazide) .Marland Kitchen.. 1 by mouth once daily 3)  Hydrochlorothiazide 25 Mg Tabs (Hydrochlorothiazide) .Marland Kitchen.. 1 by mouth once daily 4)  Zocor 20 Mg Tabs (Simvastatin) .... Take 1 tablet by mouth once a day 5)  Allegra 180 Mg Tabs (Fexofenadine hcl) .Marland Kitchen.. 1 by mouth once daily 6)  Omeprazole 20 Mg Tbec (Omeprazole) .... Take 1 tablet by mouth once a day 7)  Metoprolol Succinate 50 Mg Xr24h-tab (Metoprolol succinate) .Marland Kitchen.. 1 by mouth once daily 8)  Augmentin 875-125 Mg Tabs (Amoxicillin-pot clavulanate) .Marland Kitchen.. 1 by mouth two times a day with food for bronchitis for 10 days 9)  Tessalon Perles 100 Mg Caps (Benzonatate) .Marland Kitchen.. 1 by mouth up to three times a day as needed for cough swallow whole  Patient Instructions: 1)  you can try mucinex over the counter twice daily as directed and nasal saline spray for congestion 2)  tylenol over the counter as directed may help with aches, headache and fever 3)  call if symptoms worsen or if not improved in 4-5 days  4)  take the augmentin for bronchitis as directed with food  5)  try the tessalon for cough as needed  6)  I sent your px to pharmacy Prescriptions: ALLEGRA  180 MG  TABS (FEXOFENADINE HCL) 1 by mouth once daily  #90 x 3   Entered and Authorized by:   Judith Part MD   Signed by:   Judith Part MD on 09/14/2009   Method used:   Electronically to        [facility] [address] (retail)       [address], [address]       [place], [place]  [address]       Ph: [phone]       Fax: [phone]   RxID:   [id number] TESSALON PERLES 100 MG CAPS (BENZONATATE) 1 by mouth up to three times a day as needed for cough swallow whole  #30 x 0   Entered and Authorized by:   Judith Part MD   Signed by:   Judith Part MD on 09/14/2009   Method used:   Electronically to        [facility] [address] (  retail)       [address], [address]       [place], [place]  [address]       Ph: [phone]       Fax: [phone]   RxID:   [id number] AUGMENTIN 875-125 MG TABS (AMOXICILLIN-POT CLAVULANATE) 1 by mouth two times a day with food for bronchitis for 10 days  #20 x 0   Entered and Authorized by:   Judith Part MD   Signed by:   Judith Part MD on 09/14/2009   Method used:   Electronically to        [facility] [address] (retail)       [address]       [place], [place]  [address]       Ph: [phone]       Fax: [phone]   RxID:   [id number]   Current Allergies (reviewed today): ! ZITHROMAX ! LEVAQUIN NORVASC (AMLODIPINE BESYLATE) FELDENE (PIROXICAM) LIPITOR (ATORVASTATIN CALCIUM) ADVIL (IBUPROFEN)

## 2010-08-25 NOTE — Progress Notes (Signed)
Summary: Diovan samples  Phone Note Call from Patient Call back at Home Phone [phone]   Caller: Patient Call For: Judith Part MD Summary of Call: Patient called to see if we had samples of Diovan HCT 160/12.5mg .  She said that her mail order has been delayed and she has been without her Diovan for about 5 days.  We do have one bottle.  Expiration date 04/2010 Lot number [id number].  Advised patient that I will leave this sample at front desk so she can pick it up.   Initial call taken by: Linde Gillis CMA Duncan Dull),  Nov 23, 2009 2:22 PM  Follow-up for Phone Call        that is fine Follow-up by: Judith Part MD,  Nov 23, 2009 3:40 PM

## 2010-08-31 NOTE — Assessment & Plan Note (Signed)
Summary: sinus infection/jbb   Vital Signs:  Patient Profile:   77 Years Old Female CC:      sinus headache, sinus pressure x 3 days Height:     64 inches Weight:      132 pounds BMI:     22.74 O2 Sat:      96 % O2 treatment:    Room Air Pulse rate:   62 / minute Resp:     16 per minute BP sitting:   156 / 59  (right arm)  Vitals Entered By: Adella Hare LPN (August 20, 2010 1:33 PM)                  Prior Medication List:  FLEXERIL 10 MG TABS (CYCLOBENZAPRINE HCL) Take 1/2 to 1 by mouth up to three times a day as needed DIOVAN HCT 160-12.5 MG  TABS (VALSARTAN-HYDROCHLOROTHIAZIDE) 1 by mouth once daily HYDROCHLOROTHIAZIDE 25 MG  TABS (HYDROCHLOROTHIAZIDE) 1 by mouth once daily ZOCOR 20 MG  TABS (SIMVASTATIN) Take 1 tablet by mouth once a day ALLEGRA 180 MG  TABS (FEXOFENADINE HCL) 1 by mouth once daily as needed OMEPRAZOLE 20 MG TBEC (OMEPRAZOLE) Take 1 tablet by mouth once a day METOPROLOL SUCCINATE 50 MG XR24H-TAB (METOPROLOL SUCCINATE) 1 by mouth once daily HYDROCODONE-HOMATROPINE 5-1.5 MG/5ML SYRP (HYDROCODONE-HOMATROPINE) 1 teapoon by mouth up to every 6 hours as needed severe cough VICODIN 5-500 MG TABS (HYDROCODONE-ACETAMINOPHEN) 1 by mouth up to every 4-6 hours as needed back pain  watch for sedation   Current Allergies: ! ZITHROMAX ! LEVAQUIN ! BIAXIN NORVASC (AMLODIPINE BESYLATE) FELDENE (PIROXICAM) LIPITOR (ATORVASTATIN CALCIUM) ADVIL (IBUPROFEN)History of Present Illness Chief Complaint: sinus headache, sinus pressure x 3 days History of Present Illness: Patient w/sinus pressure for 3 days.  Shereports this happens on a yearly basis requiring in the past Penicllin injections and lately a  z-pack. She uses muccinex-d w/it.  REVIEW OF SYSTEMS Constitutional Symptoms       Complains of chills.     Denies fever, night sweats, weight loss, weight gain, and fatigue.  Ear/Nose/Throat/Mouth       Complains of frequent runny nose and sinus problems.      Denies  hearing loss/aids, change in hearing, ear pain, ear discharge, frequent nose bleeds, sore throat, hoarseness, and tooth pain or bleeding.  Respiratory       Complains of productive cough.      Denies dry cough, wheezing, shortness of breath, asthma, bronchitis, and emphysema/COPD.  Cardiovascular       Denies chest pain and tires easily with exhertion.    Genitourniary       Denies painful urination and blood or discharge from vagina. Neurological       Complains of headaches.      Denies seizures. Musculoskeletal       Denies muscle pain, joint pain, and decreased range of motion.  Skin       Denies bruising.   Past History:  Past Medical History: Last updated: 05/08/2008 Diverticulosis, colon Hyperlipidemia Hypertension Osteoarthritis Urinary incontinence IBS hx of c difficile in past - treated   Past Surgical History: Last updated: 03/31/2009 Appendectomy- with infection (surgery times 2) Hysterectomy Tonsillectomy Bladder tack Breast cyst needle biopsy Left foot surgery- morton's neuroma Dexa- normal (12/1999) EGD- dilated esoph stricture (1997) EGD- dilated stricture, gastritis (08/2002) Breast discharge, cyst removed (08/2002) Colonoscopy- diverticulosis, hemorrhoids (1999) Colonoscopy (09/2004) Shoulder surgery- spur EGD, BE- neg (1997, 1998) CT abd/ pelvis (12/1996) CT abd- neg (05/2006), small hiatal hernia  9/10 colonoscopy diverticulosis   Family History: Last updated: 02-04-07 Father: deceased age 71- MI Mother: deceased age 25- kidney cancer Siblings: 1 brother, 3 sisters sister MI, lung cancer  Social History: Last updated: 12/10/2008 Marital Status:  Children:  Occupation:  remote hx of brief light smoking hx of second hand smoke exp Alcohol Use - no Daily Caffeine Use 1 per day Illicit Drug Use - no Patient gets regular exercise.  Risk Factors: Exercise: yes (12/10/2008)  Risk Factors: Smoking Status: never (01/22/2007)  Family  History: Reviewed history from 04-Feb-2007 and no changes required. Father: deceased age 21- MI Mother: deceased age 33- kidney cancer Siblings: 1 brother, 3 sisters sister MI, lung cancer  Social History: Reviewed history from 12/10/2008 and no changes required. Marital Status:  Children:  Occupation:  remote hx of brief light smoking hx of second hand smoke exp Alcohol Use - no Daily Caffeine Use 1 per day Illicit Drug Use - no Patient gets regular exercise. Physical Exam General appearance: well developed, well nourished, mild distress Head: normocephalic, atraumatic Pupils: equal, round, reactive to light Ears: normal, no lesions or deformities Nasal: pale, boggy, swollen nasal turbinates Oral/Pharynx: tongue normal, posterior pharynx without erythema or exudate Neck: neck supple,  trachea midline, no masses Skin: no obvious rashes or lesions MSE: oriented to time, place, and person Assessment New Problems: ACUTE SINUSITIS, UNSPECIFIED (ICD-461.9)  sinusitis  Plan New Medications/Changes: ZITHROMAX Z-PAK 250 MG  TABS (AZITHROMYCIN) Use as directed  #1 x 0, 08/20/2010, Hassan Rowan MD  New Orders: New Patient Level III [[address]] Follow Up: Follow up in 2-3 days if no improvement, Follow up with Primary Physician  The patient and/or caregiver has been counseled thoroughly with regard to medications prescribed including dosage, schedule, interactions, rationale for use, and possible side effects and they verbalize understanding.  Diagnoses and expected course of recovery discussed and will return if not improved as expected or if the condition worsens. Patient and/or caregiver verbalized understanding.  Prescriptions: ZITHROMAX Z-PAK 250 MG  TABS (AZITHROMYCIN) Use as directed  #1 x 0   Entered and Authorized by:   Hassan Rowan MD   Signed by:   Hassan Rowan MD on 08/20/2010   Method used:   Print then Give to Patient   RxID:   [id number]   Patient  Instructions: 1)  Please schedule a follow-up appointment as needed. 2)  Please schedule an appointment with your primary doctor in :&-14 dsays if not bettter 3)  Acute sinusitis symptoms for less than 10 days are not helped by antibiotics.Use warm moist compresses, and over the counter decongestants ( only as directed). Call if no improvement in 5-7 days, sooner if increasing pain, fever, or new symptoms. 4)  Take your antibiotic as prescribed until ALL of it is gone, but stop if you develop a rash or swelling and contact our office as soon as possible.  Orders Added: 1)  New Patient Level III [[address]]

## 2010-09-06 ENCOUNTER — Telehealth: Payer: Self-pay | Admitting: Family Medicine

## 2010-09-14 NOTE — Progress Notes (Signed)
Summary: Hydrocodone/APAP 5-500mg   Phone Note Refill Request Call back at [phone] Message from:  [facility] [address] on September 06, 2010 3:28 PM  Refills Requested: Medication #1:  VICODIN 5-500 MG TABS 1 by mouth up to every 4-6 hours as needed back pain  watch for sedation.   Last Refilled: 07/15/2010 [facility] [address] elelctronically request refill on Hydorcodone/APAP 5-500mg . taking one tab by outh every 4-6 hrs as needed for pain.Last refill date 07/15/10.Please advise.    Method Requested: Telephone to Pharmacy Initial call taken by: Lewanda Rife LPN,  September 06, 2010 3:30 PM  Follow-up for Phone Call        px written on EMR for call in  Follow-up by: Judith Part MD,  September 06, 2010 4:23 PM  Additional Follow-up for Phone Call Additional follow up Details #1::        Medication phoned to [facility] [address] [facility] as instructed. Lewanda Rife LPN  September 06, 2010 4:30 PM     Prescriptions: VICODIN 5-500 MG TABS (HYDROCODONE-ACETAMINOPHEN) 1 by mouth up to every 4-6 hours as needed back pain  watch for sedation  #30 x 0   Entered and Authorized by:   Judith Part MD   Signed by:   Judith Part MD on 09/06/2010   Method used:   Telephoned to ...         RxID:   [id number]

## 2010-09-29 ENCOUNTER — Encounter: Payer: Self-pay | Admitting: Family Medicine

## 2010-10-05 ENCOUNTER — Encounter: Payer: Self-pay | Admitting: Family Medicine

## 2010-10-07 ENCOUNTER — Other Ambulatory Visit: Payer: Self-pay | Admitting: Family Medicine

## 2010-10-07 ENCOUNTER — Encounter: Payer: Self-pay | Admitting: *Deleted

## 2010-10-07 ENCOUNTER — Other Ambulatory Visit (INDEPENDENT_AMBULATORY_CARE_PROVIDER_SITE_OTHER): Payer: Medicare Other

## 2010-10-07 DIAGNOSIS — M549 Dorsalgia, unspecified: Secondary | ICD-10-CM

## 2010-10-07 DIAGNOSIS — R933 Abnormal findings on diagnostic imaging of other parts of digestive tract: Secondary | ICD-10-CM

## 2010-10-07 DIAGNOSIS — I10 Essential (primary) hypertension: Secondary | ICD-10-CM

## 2010-10-07 DIAGNOSIS — E7800 Pure hypercholesterolemia, unspecified: Secondary | ICD-10-CM

## 2010-10-07 LAB — BASIC METABOLIC PANEL
CO2: 31 mEq/L (ref 19–32)
Chloride: 101 mEq/L (ref 96–112)
Creatinine, Ser: 1 mg/dL (ref 0.4–1.2)
Potassium: 4.4 mEq/L (ref 3.5–5.1)
Sodium: 138 mEq/L (ref 135–145)

## 2010-10-07 LAB — TSH: TSH: 2.76 u[IU]/mL (ref 0.35–5.50)

## 2010-10-07 LAB — CBC WITH DIFFERENTIAL/PLATELET
Basophils Absolute: 0 10*3/uL (ref 0.0–0.1)
Basophils Relative: 0.2 % (ref 0.0–3.0)
Eosinophils Absolute: 0 10*3/uL (ref 0.0–0.7)
Eosinophils Relative: 0.6 % (ref 0.0–5.0)
HCT: 35.4 % — ABNORMAL LOW (ref 36.0–46.0)
Hemoglobin: 12.2 g/dL (ref 12.0–15.0)
Lymphocytes Relative: 29 % (ref 12.0–46.0)
Lymphs Abs: 2.2 10*3/uL (ref 0.7–4.0)
MCHC: 34.4 g/dL (ref 30.0–36.0)
MCV: 93.9 fl (ref 78.0–100.0)
Monocytes Absolute: 0.4 10*3/uL (ref 0.1–1.0)
Monocytes Relative: 4.9 % (ref 3.0–12.0)
Neutro Abs: 5 10*3/uL (ref 1.4–7.7)
Neutrophils Relative %: 65.3 % (ref 43.0–77.0)
Platelets: 183 10*3/uL (ref 150.0–400.0)
RBC: 3.77 Mil/uL — ABNORMAL LOW (ref 3.87–5.11)
RDW: 13 % (ref 11.5–14.6)
WBC: 7.7 10*3/uL (ref 4.5–10.5)

## 2010-10-07 LAB — LIPID PANEL
Cholesterol: 157 mg/dL (ref 0–200)
HDL: 45.4 mg/dL
LDL Cholesterol: 89 mg/dL (ref 0–99)
Total CHOL/HDL Ratio: 3
Triglycerides: 112 mg/dL (ref 0.0–149.0)
VLDL: 22.4 mg/dL (ref 0.0–40.0)

## 2010-10-07 LAB — HEPATIC FUNCTION PANEL
ALT: 15 U/L (ref 0–35)
Albumin: 4.4 g/dL (ref 3.5–5.2)
Total Protein: 6.8 g/dL (ref 6.0–8.3)

## 2010-10-12 ENCOUNTER — Encounter: Payer: Self-pay | Admitting: Family Medicine

## 2010-10-12 ENCOUNTER — Ambulatory Visit (INDEPENDENT_AMBULATORY_CARE_PROVIDER_SITE_OTHER): Payer: Medicare Other | Admitting: Family Medicine

## 2010-10-12 DIAGNOSIS — IMO0002 Reserved for concepts with insufficient information to code with codable children: Secondary | ICD-10-CM

## 2010-10-12 DIAGNOSIS — I10 Essential (primary) hypertension: Secondary | ICD-10-CM

## 2010-10-12 DIAGNOSIS — Z1231 Encounter for screening mammogram for malignant neoplasm of breast: Secondary | ICD-10-CM | POA: Insufficient documentation

## 2010-10-12 DIAGNOSIS — E7800 Pure hypercholesterolemia, unspecified: Secondary | ICD-10-CM

## 2010-10-12 DIAGNOSIS — Z0000 Encounter for general adult medical examination without abnormal findings: Secondary | ICD-10-CM | POA: Insufficient documentation

## 2010-10-12 DIAGNOSIS — M1990 Unspecified osteoarthritis, unspecified site: Secondary | ICD-10-CM

## 2010-10-12 MED ORDER — VALSARTAN-HYDROCHLOROTHIAZIDE 160-12.5 MG PO TABS: 1.0000 | ORAL_TABLET | Freq: Every day | ORAL | Status: DC

## 2010-10-12 MED ORDER — HYDROCODONE-ACETAMINOPHEN 5-500 MG PO TABS: 1.0000 | ORAL_TABLET | ORAL | Status: DC | PRN

## 2010-10-12 MED ORDER — HYDROCHLOROTHIAZIDE 25 MG PO TABS: 25.0000 mg | ORAL_TABLET | Freq: Every day | ORAL | Status: DC

## 2010-10-12 MED ORDER — METOPROLOL TARTRATE 25 MG PO TABS: 25.0000 mg | ORAL_TABLET | Freq: Two times a day (BID) | ORAL | Status: DC

## 2010-10-12 MED ORDER — SIMVASTATIN 20 MG PO TABS: 20.0000 mg | ORAL_TABLET | Freq: Every day | ORAL | Status: DC

## 2010-10-12 MED ORDER — OMEPRAZOLE 20 MG PO CPDR: 20.0000 mg | DELAYED_RELEASE_CAPSULE | Freq: Every day | ORAL | Status: DC

## 2010-10-12 NOTE — Assessment & Plan Note (Signed)
Disc shingles vaccine in future- will check with her ins co Disc dexa in future- not interested now - disc ca and vit D  Mam scheduled colonosc up to date

## 2010-10-12 NOTE — Assessment & Plan Note (Signed)
Controlled but need to change to metoprolol tartarate for the cost  Will f/u 6 mo for re check  Pulse on low side- no symptoms

## 2010-10-12 NOTE — Progress Notes (Signed)
Subjective:    Patient ID: Rachel Knox, female    DOB: 05-Jul-1928, 77 y.o.   MRN: [id number]  HPI Here for check up of chronic med problems and also to review health mt list   Has severe oa and needs hydrocodone - acet 5-500 Tries to only take if she needs it  Usually needs 1 pill per day absolute maximum   Wt is down 2 lb No change in ht 64 inches  Lipids trig 112 and HDL 45 and LDL 89 - that is improved from last check and well controlled  Diet is healthy and low in saturated fats  Lots of exercise - likes to walk a lot and also working - very active   Renal panel is nl with gluc of 87  CBC and liver tests and tsh unchanged Overall happy with labs and no problems   HTn in very good control with 132/56 today -- pulse a bit low -- is on beta blocker Not dizzy and no orthostatic symptoms at all  Needs to change to metoprolol tartarate for cost   Flu shot this fall Td in 07 Pneumovax utd May be interested in shingles vaccine   colonosc nl in 8/10 with some tics- not due for 10 y  Mam 4/10- this is due -missed last year due to pneumonia - wants to schedule Self exam--no lumps   hyst in past - does not need pap No gyn problems   Bone density status -- ca and D ? Last dexa more than 2 years  Ago - thinks was normal  Not interested yet- will call for dexa when ready  . Past Medical History  Diagnosis Date  . Diverticulosis of colon   . Hyperlipidemia   . Hypertension   . Osteoarthritis   . Urinary incontinence   . IBS (irritable bowel syndrome)     Past Surgical History  Procedure Date  . Appendectomy     with infection (surgery x2)  . Abdominal hysterectomy   . Tonsillectomy   . Bladder surgery     bladder tack  . Foot neuroma surgery   . Breast surgery     breast cyst needle biopsy  . Breast cyst excision 08/2002    breast discharge, cyst removed  . Shoulder surgery     spur    History   Social History  . Marital Status: Divorced    Spouse  Name: N/A    Number of Children: N/A  . Years of Education: N/A   Occupational History  . Not on file.   Social History Main Topics  . Smoking status: Former Games developer  . Smokeless tobacco: Not on file  . Alcohol Use: No  . Drug Use: No  . Sexually Active:    Other Topics Concern  . Not on file   Social History Narrative  . No narrative on file    Family History  Problem Relation Age of Onset  . Cancer Mother     kidney cancer  . Heart disease Father     MI  . Cancer Sister     lung  . Heart disease Sister     MI       Review of Systems  Constitutional: Positive for fatigue. Negative for fever and activity change.  HENT: Negative.   Eyes: Negative for pain and visual disturbance.  Cardiovascular: Negative.   Gastrointestinal: Negative for nausea, abdominal pain and blood in stool.  Genitourinary: Negative for hematuria, vaginal bleeding and  pelvic pain.  Musculoskeletal: Positive for back pain and arthralgias. Negative for myalgias and joint swelling.  Skin: Negative for pallor, rash and wound.  Neurological: Negative for tremors, light-headedness and headaches.  Hematological: Does not bruise/bleed easily.  Psychiatric/Behavioral: Negative for sleep disturbance. The patient is nervous/anxious.        Objective:   Physical Exam  Constitutional: She appears well-developed and well-nourished.  HENT:  Head: Normocephalic and atraumatic.  Right Ear: External ear normal.  Left Ear: External ear normal.  Nose: Nose normal.  Eyes: Conjunctivae and EOM are normal. Pupils are equal, round, and reactive to light.  Neck: Normal range of motion. Neck supple. No JVD present. No thyromegaly present.       No carotid bruits  Cardiovascular: Normal rate, regular rhythm and normal heart sounds.   Pulmonary/Chest: Effort normal and breath sounds normal.  Abdominal: Soft. Bowel sounds are normal. She exhibits no distension and no mass. There is no tenderness.  Genitourinary:  No breast swelling, tenderness, discharge or bleeding. Pelvic exam was performed with patient supine.  Musculoskeletal: She exhibits tenderness. She exhibits no edema.  Neurological: She displays normal reflexes. No cranial nerve deficit.       Gait is steady  Skin: Skin is warm and dry. No rash noted.  Psychiatric: She has a normal mood and affect.   No breast masses noted        Assessment & Plan:

## 2010-10-12 NOTE — Assessment & Plan Note (Signed)
Pt does well with this Refilled vicodin for cautious use  Not worsened

## 2010-10-12 NOTE — Patient Instructions (Addendum)
Will change your metoprolol er to metoprolol tartarate Let me know if any problems with this We will schedule your mammogram at check out  Remember to take daily calcium and vitamin D If you are interested in shingles shot in future --call your insurance company and then let us know If you are interested in bone density test in the future let me know  Follow up with me in about 6 months

## 2010-10-12 NOTE — Assessment & Plan Note (Signed)
Severe in fingers and back- worsening with time Refilled vicodin- which she tries to use sparingly Would not consider nsaid at her age due to risks Given warnings for narcotic

## 2010-10-12 NOTE — Assessment & Plan Note (Signed)
Breast exam nl today Mammogram scheduled Reminded pt to do self exams monthly

## 2010-10-12 NOTE — Assessment & Plan Note (Signed)
In good control with simvastatin and diet Rev labs with pt  Rev low sat fat diet with pt  Renewed med for mail order

## 2010-11-11 ENCOUNTER — Telehealth: Payer: Self-pay

## 2010-11-11 ENCOUNTER — Ambulatory Visit: Payer: Self-pay | Admitting: Family Medicine

## 2010-11-11 LAB — HM MAMMOGRAPHY: HM Mammogram: NORMAL

## 2010-11-11 NOTE — Telephone Encounter (Signed)
Letter mailed to patient as instructed.Health maintenance updated. 

## 2010-11-16 ENCOUNTER — Encounter: Payer: Self-pay | Admitting: Family Medicine

## 2010-12-07 ENCOUNTER — Other Ambulatory Visit: Payer: Self-pay | Admitting: Family Medicine

## 2010-12-08 NOTE — Telephone Encounter (Signed)
Px written for call in   

## 2010-12-08 NOTE — Telephone Encounter (Signed)
Medication phoned to [facility] [address] pharmacy as instructed.  

## 2010-12-30 ENCOUNTER — Telehealth: Payer: Self-pay | Admitting: *Deleted

## 2010-12-30 MED ORDER — ZOSTER VACCINE LIVE 19400 UNT/0.65ML ~~LOC~~ SOLR: 0.6500 mL | Freq: Once | SUBCUTANEOUS | Status: DC

## 2010-12-30 NOTE — Telephone Encounter (Signed)
Patient has checked with her insurance and her copay for the shingles vaccine will be $93.00. She is asking for a rx for the vaccine to be sent to [facility] [address]. She would like to go and get this on Monday.

## 2010-12-30 NOTE — Telephone Encounter (Signed)
Here is px for vaccine to call in

## 2010-12-30 NOTE — Telephone Encounter (Signed)
Medication phoned to [facility] pharmacy as instructed. Patient notified as instructed by telephone.

## 2011-01-20 ENCOUNTER — Other Ambulatory Visit: Payer: Self-pay | Admitting: Family Medicine

## 2011-01-20 NOTE — Telephone Encounter (Signed)
[facility] [address] sent electronic refill request for Hydrocodone APAP 5-500.Please advise.

## 2011-01-20 NOTE — Telephone Encounter (Signed)
Rx called to pharmacy

## 2011-01-20 NOTE — Telephone Encounter (Signed)
Px written for call in   

## 2011-02-27 ENCOUNTER — Other Ambulatory Visit: Payer: Self-pay | Admitting: Family Medicine

## 2011-02-27 NOTE — Telephone Encounter (Signed)
[facility] [address] electronically request refill on Vicodin 5-500mg . I spoke with Fontaine at [facility] [address] and refill phone in on 01/20/11 was for #30 did not get 1 addl refill. Fontaine will add on the #30 refill for today.

## 2011-04-03 ENCOUNTER — Other Ambulatory Visit: Payer: Self-pay | Admitting: Family Medicine

## 2011-04-03 NOTE — Telephone Encounter (Signed)
[facility] [address] electronically request refill Hydrocodone APAP 5-500 mg.Please advise.

## 2011-04-03 NOTE — Telephone Encounter (Signed)
Px written for call in   

## 2011-04-04 ENCOUNTER — Other Ambulatory Visit: Payer: Self-pay | Admitting: Family Medicine

## 2011-04-04 NOTE — Telephone Encounter (Signed)
I think I just did this yesterday?

## 2011-04-04 NOTE — Telephone Encounter (Signed)
I did call med yesterday to [facility] [address]. I spoke with Crystal at [facility] [address] and she said med was ready not sure why another request came to [place].

## 2011-04-04 NOTE — Telephone Encounter (Signed)
Medication phoned to [facility] [address] pharmacy as instructed.

## 2011-04-11 ENCOUNTER — Ambulatory Visit: Payer: Medicare Other | Admitting: Family Medicine

## 2011-04-11 DIAGNOSIS — Z0289 Encounter for other administrative examinations: Secondary | ICD-10-CM

## 2011-04-17 ENCOUNTER — Ambulatory Visit: Payer: Medicare Other | Admitting: Family Medicine

## 2011-04-18 ENCOUNTER — Ambulatory Visit: Payer: Medicare Other | Admitting: Family Medicine

## 2011-04-26 ENCOUNTER — Ambulatory Visit (INDEPENDENT_AMBULATORY_CARE_PROVIDER_SITE_OTHER): Payer: Medicare Other | Admitting: Family Medicine

## 2011-04-26 ENCOUNTER — Encounter: Payer: Self-pay | Admitting: Family Medicine

## 2011-04-26 VITALS — BP 140/60 | HR 55 | Temp 97.9°F | Ht 64.0 in | Wt 136.2 lb

## 2011-04-26 DIAGNOSIS — M1990 Unspecified osteoarthritis, unspecified site: Secondary | ICD-10-CM

## 2011-04-26 DIAGNOSIS — IMO0002 Reserved for concepts with insufficient information to code with codable children: Secondary | ICD-10-CM | POA: Insufficient documentation

## 2011-04-26 DIAGNOSIS — Z23 Encounter for immunization: Secondary | ICD-10-CM

## 2011-04-26 DIAGNOSIS — T148XXA Other injury of unspecified body region, initial encounter: Secondary | ICD-10-CM

## 2011-04-26 DIAGNOSIS — I10 Essential (primary) hypertension: Secondary | ICD-10-CM

## 2011-04-26 MED ORDER — HYDROCODONE-ACETAMINOPHEN 5-500 MG PO TABS: 1.0000 | ORAL_TABLET | ORAL | Status: DC | PRN

## 2011-04-26 NOTE — Patient Instructions (Signed)
Use antibiotic ointment on cut on arm until healed - and let me know if it gets red or painful Flu shot and Tdap shots today Blood pressure is ok  No change in medicines Follow up in 6 months for 30 minute annual check up with labs prior

## 2011-04-26 NOTE — Assessment & Plan Note (Signed)
This is fairly controlled with metoprolol tartrate and well tolerated  Pulse in 50s - no problems  Disc sodium avoidance and will continue to monitor F/u 6 mo with lab prior

## 2011-04-26 NOTE — Progress Notes (Signed)
Subjective:    Patient ID: Rachel Knox, female    DOB: [birth year], 77 y.o.   MRN: [id number]  HPI Here for f/u of HTN , OA and skin tear   bp is up a bit for first check today at 140/58 Still on beta blocker but had to change to short acting due to cost Pulse is 48- no dizziness or fatigue  No chest pain or ha or edema   Wt is up 5 lb --bmi still good at 23  - has been at home more   Mam nl April - was normal   Flu shot-- will get today  Zoster status-- got her shot at [facility] , glad she got it - just after her last appt   In a rush today- skin tear on her arm on the way to work  Was on clean metal- really thin Does not hurt much - and stopped bleeding  Not painful  Not red or swollen  Needs refil on her pain med for arthritis- uses sparingly   Patient Active Problem List  Diagnoses  . CLOSTRIDIUM DIFFICILE COLITIS  . HYPERCHOLESTEROLEMIA, PURE  . HYPERTENSION, ESSENTIAL NOS  . ESOPHAGEAL STRICTURE  . IBS  . DEGENERATIVE DISC DISEASE  . BACK PAIN  . ABNORMAL FINDINGS GI TRACT  . Other screening mammogram  . Osteoarthritis  . Preventative health care  . Skin tear   Past Medical History  Diagnosis Date  . Diverticulosis of colon   . Hyperlipidemia   . Hypertension   . Osteoarthritis   . Urinary incontinence   . IBS (irritable bowel syndrome)    Past Surgical History  Procedure Date  . Appendectomy     with infection (surgery x2)  . Abdominal hysterectomy   . Tonsillectomy   . Bladder surgery     bladder tack  . Foot neuroma surgery   . Breast surgery     breast cyst needle biopsy  . Breast cyst excision 08/2002    breast discharge, cyst removed  . Shoulder surgery     spur   History  Substance Use Topics  . Smoking status: Former Games developer  . Smokeless tobacco: Not on file  . Alcohol Use: No   Family History  Problem Relation Age of Onset  . Cancer Mother     kidney cancer  . Heart disease Father     MI  . Cancer Sister     lung  .  Heart disease Sister     MI   Allergies  Allergen Reactions  . Amlodipine Besylate     REACTION: swelling  . Atorvastatin     REACTION: unspecified  . Azithromycin     REACTION: nausea and vomiting  . Clarithromycin     REACTION: severe diarrhea  . Ibuprofen     REACTION: unspecified  . Levofloxacin     REACTION: diarrhea  . Piroxicam     REACTION: unspecified   Current Outpatient Prescriptions on File Prior to Visit  Medication Sig Dispense Refill  . hydrochlorothiazide 25 MG tablet Take 1 tablet (25 mg total) by mouth daily.  90 tablet  3  . metoprolol tartrate (LOPRESSOR) 25 MG tablet Take 1 tablet (25 mg total) by mouth 2 (two) times daily.  180 tablet  3  . omeprazole (PRILOSEC) 20 MG capsule Take 1 capsule (20 mg total) by mouth daily.  90 capsule  3  . simvastatin (ZOCOR) 20 MG tablet Take 1 tablet (20 mg total) by mouth daily.  90 tablet  3  . valsartan-hydrochlorothiazide (DIOVAN-HCT) 160-12.5 MG per tablet Take 1 tablet by mouth daily.  90 tablet  3  . cyclobenzaprine (FLEXERIL) 10 MG tablet 1/2 - 1 by mouth up to three times a day as needed.       . fexofenadine (ALLEGRA) 180 MG tablet Take 180 mg by mouth daily. as needed.             Review of Systems Review of Systems  Constitutional: Negative for fever, appetite change, fatigue and unexpected weight change.  Eyes: Negative for pain and visual disturbance.  Respiratory: Negative for cough and shortness of breath.   Cardiovascular: Negative for cp or palpitations    Gastrointestinal: Negative for nausea, diarrhea and constipation.  Genitourinary: Negative for urgency and frequency.  Skin: Negative for pallor or rash  pos for new wound  Neurological: Negative for weakness, light-headedness, numbness and headaches.  Hematological: Negative for adenopathy. Does not bruise/bleed easily.  Psychiatric/Behavioral: Negative for dysphoric mood. The patient is not nervous/anxious.          Objective:   Physical  Exam  Constitutional: She appears well-developed and well-nourished. No distress.  HENT:  Head: Normocephalic and atraumatic.  Mouth/Throat: Oropharynx is clear and moist.  Eyes: Conjunctivae and EOM are normal. Pupils are equal, round, and reactive to light.  Neck: Normal range of motion. Neck supple. No JVD present. Carotid bruit is not present. No thyromegaly present.  Cardiovascular: Normal rate, regular rhythm, normal heart sounds and intact distal pulses.  Exam reveals no gallop.   Pulmonary/Chest: Effort normal and breath sounds normal. No respiratory distress. She has no wheezes.  Abdominal: Soft. Bowel sounds are normal. She exhibits no distension, no abdominal bruit and no mass. There is no tenderness.  Musculoskeletal: Normal range of motion. She exhibits no edema and no tenderness.  Lymphadenopathy:    She has no cervical adenopathy.  Neurological: She is alert. She has normal reflexes. No cranial nerve deficit. Coordination normal.  Skin: Skin is warm and dry. No rash noted. No pallor.       3-4 cm skin scratch/tear in V shape on L upper arm  Dried blood present No redness or swelling and slt tender Cleaned with alcohol/ water and dressed with antibiotic ointment and large bandaid           Assessment & Plan:

## 2011-04-26 NOTE — Assessment & Plan Note (Signed)
Refilled vicodin today for use with caution  Pt does not overuse it  Disc imp of staying active

## 2011-04-26 NOTE — Assessment & Plan Note (Signed)
On L arm - minor  Update tetnus - Tdap  Cleaned / applied triple abx oint and new bandaid Given inst for care inst to call if any worsening

## 2011-11-17 ENCOUNTER — Other Ambulatory Visit: Payer: Self-pay

## 2011-11-17 NOTE — Telephone Encounter (Signed)
Pt named [name] left v/m needed 5 refills sent to [facility]. Pt did not leave DOB, phone #,or names of medicine needing refill. This is only chart that came up with [name]'s name. I left v/m on pts phone if she had left v/m requesting refills to please call back.

## 2011-11-20 NOTE — Telephone Encounter (Signed)
Pt left v/m today that she wanted HCTZ 25 mg, Metoprolol 25 mg, omeprazole 20 mg ,Valsartan HCTZ 160/12.5 and simvastatin 20 [facility] Pt left no dob,no phone # again if this pt is correct pt. Unable to reach pt and will try again.

## 2011-11-22 NOTE — Telephone Encounter (Signed)
Tried calling this pt to see if this is correct pts chart. No answer.

## 2011-11-24 MED ORDER — OMEPRAZOLE 20 MG PO CPDR: 20.0000 mg | DELAYED_RELEASE_CAPSULE | Freq: Every day | ORAL | Status: DC

## 2011-11-24 MED ORDER — METOPROLOL TARTRATE 25 MG PO TABS: 25.0000 mg | ORAL_TABLET | Freq: Two times a day (BID) | ORAL | Status: DC

## 2011-11-24 MED ORDER — HYDROCHLOROTHIAZIDE 25 MG PO TABS: 25.0000 mg | ORAL_TABLET | Freq: Every day | ORAL | Status: DC

## 2011-11-24 MED ORDER — SIMVASTATIN 20 MG PO TABS: 20.0000 mg | ORAL_TABLET | Freq: Every day | ORAL | Status: DC

## 2011-11-24 MED ORDER — VALSARTAN-HYDROCHLOROTHIAZIDE 160-12.5 MG PO TABS: 1.0000 | ORAL_TABLET | Freq: Every day | ORAL | Status: DC

## 2011-11-24 NOTE — Telephone Encounter (Signed)
Spoke with pt and verified DOB, meds with instructions verified. HCTZ 25 mg #90 x 0, Metoprolol 25 mg #180 x 0, Valsartan-HCTZ 160/12.5 mg # 90 x 0,simvastatin 20 mg #90 x 0 and omeprazole 20 mg # 90 x 0 sent electronically to [facility] as requested by pt. Pt to schedule CPX with labs prior to refills running out. Pt verbalized understanding and pt said would call back to schedule.

## 2011-12-07 ENCOUNTER — Telehealth: Payer: Self-pay | Admitting: Family Medicine

## 2011-12-07 DIAGNOSIS — E7800 Pure hypercholesterolemia, unspecified: Secondary | ICD-10-CM

## 2011-12-07 DIAGNOSIS — I10 Essential (primary) hypertension: Secondary | ICD-10-CM

## 2011-12-07 DIAGNOSIS — K222 Esophageal obstruction: Secondary | ICD-10-CM

## 2011-12-07 NOTE — Telephone Encounter (Signed)
Message copied by Judy Pimple on Thu Dec 07, 2011  7:16 PM ------      Message from: Baldomero Lamy      Created: Wed Dec 06, 2011 11:02 AM      Regarding: Cpx labs Mon 5/20       .Please order  future cpx labs for pt's upcomming lab appt.      Thanks      Rodney Booze

## 2011-12-11 ENCOUNTER — Other Ambulatory Visit (INDEPENDENT_AMBULATORY_CARE_PROVIDER_SITE_OTHER): Payer: Medicare Other

## 2011-12-11 DIAGNOSIS — I10 Essential (primary) hypertension: Secondary | ICD-10-CM

## 2011-12-11 DIAGNOSIS — K222 Esophageal obstruction: Secondary | ICD-10-CM

## 2011-12-11 DIAGNOSIS — E7800 Pure hypercholesterolemia, unspecified: Secondary | ICD-10-CM

## 2011-12-11 LAB — CBC WITH DIFFERENTIAL/PLATELET
Basophils Absolute: 0 10*3/uL (ref 0.0–0.1)
Basophils Relative: 0.3 % (ref 0.0–3.0)
Eosinophils Absolute: 0.1 10*3/uL (ref 0.0–0.7)
HCT: 38.3 % (ref 36.0–46.0)
Hemoglobin: 12.6 g/dL (ref 12.0–15.0)
Lymphs Abs: 2.3 10*3/uL (ref 0.7–4.0)
MCHC: 33 g/dL (ref 30.0–36.0)
MCV: 93.6 fl (ref 78.0–100.0)
Monocytes Absolute: 0.5 10*3/uL (ref 0.1–1.0)
Neutro Abs: 5.1 10*3/uL (ref 1.4–7.7)
RDW: 12.5 % (ref 11.5–14.6)

## 2011-12-11 LAB — COMPREHENSIVE METABOLIC PANEL
ALT: 16 U/L (ref 0–35)
AST: 22 U/L (ref 0–37)
BUN: 22 mg/dL (ref 6–23)
Creatinine, Ser: 1 mg/dL (ref 0.4–1.2)
Total Bilirubin: 0.8 mg/dL (ref 0.3–1.2)

## 2011-12-11 LAB — LIPID PANEL
HDL: 50.1 mg/dL (ref >39.00)
Total CHOL/HDL Ratio: 3
Triglycerides: 81 mg/dL (ref 0.0–149.0)
VLDL: 16.2 mg/dL (ref 0.0–40.0)

## 2011-12-12 LAB — TSH: TSH: 2.8 u[IU]/mL (ref 0.35–5.50)

## 2011-12-13 ENCOUNTER — Ambulatory Visit (INDEPENDENT_AMBULATORY_CARE_PROVIDER_SITE_OTHER): Payer: Medicare Other | Admitting: Family Medicine

## 2011-12-13 ENCOUNTER — Encounter: Payer: Self-pay | Admitting: Family Medicine

## 2011-12-13 VITALS — BP 138/60 | HR 60 | Temp 98.2°F | Ht 64.0 in | Wt 126.0 lb

## 2011-12-13 DIAGNOSIS — E7800 Pure hypercholesterolemia, unspecified: Secondary | ICD-10-CM

## 2011-12-13 DIAGNOSIS — Z1231 Encounter for screening mammogram for malignant neoplasm of breast: Secondary | ICD-10-CM

## 2011-12-13 DIAGNOSIS — I10 Essential (primary) hypertension: Secondary | ICD-10-CM

## 2011-12-13 MED ORDER — SIMVASTATIN 20 MG PO TABS: 20.0000 mg | ORAL_TABLET | Freq: Every day | ORAL | Status: DC

## 2011-12-13 MED ORDER — VALSARTAN-HYDROCHLOROTHIAZIDE 160-12.5 MG PO TABS: 1.0000 | ORAL_TABLET | Freq: Every day | ORAL | Status: DC

## 2011-12-13 MED ORDER — METOPROLOL TARTRATE 25 MG PO TABS: 25.0000 mg | ORAL_TABLET | Freq: Two times a day (BID) | ORAL | Status: DC

## 2011-12-13 MED ORDER — HYDROCHLOROTHIAZIDE 25 MG PO TABS: 25.0000 mg | ORAL_TABLET | Freq: Every day | ORAL | Status: DC

## 2011-12-13 MED ORDER — OMEPRAZOLE 20 MG PO CPDR: 20.0000 mg | DELAYED_RELEASE_CAPSULE | Freq: Every day | ORAL | Status: DC

## 2011-12-13 NOTE — Progress Notes (Signed)
Subjective:    Patient ID: Rachel Knox, female    DOB: 1928-03-11, 78 y.o.   MRN: [id number]  HPI Here for check up of chronic medical conditions and to review health mt list   Lots of pain from arthritis - tramadol really helps -- will see Dr Ernest Pine this mo , and no side effects   BP Readings from Last 3 Encounters:  12/13/11 138/60  04/26/11 140/60  10/12/10 132/54   bp is  good   Today No cp or palpitations or headaches or edema  No side effects to medicines    Wt is down 10 lb Had a death in the family -- her ex husband- it really bothered her - lost down to 120 and gained 6 lb back Grief really got her badly  Diet -much better now - is back to eating regularly    Labs re assuring    Chemistry      Component Value Date/Time   NA 138 12/11/2011 0816   K 3.9 12/11/2011 0816   CL 99 12/11/2011 0816   CO2 30 12/11/2011 0816   BUN 22 12/11/2011 0816   CREATININE 1.0 12/11/2011 0816      Component Value Date/Time   CALCIUM 9.2 12/11/2011 0816   ALKPHOS 53 12/11/2011 0816   AST 22 12/11/2011 0816   ALT 16 12/11/2011 0816   BILITOT 0.8 12/11/2011 0816     Lab Results  Component Value Date   WBC 8.0 12/11/2011   HGB 12.6 12/11/2011   HCT 38.3 12/11/2011   MCV 93.6 12/11/2011   PLT 196.0 12/11/2011    Lab Results  Component Value Date   TSH 2.80 12/11/2011     Hx of high chol- zocor and diet- continues to be in good control Does very much watch sat fats and dislikes junk food  Lab Results  Component Value Date   CHOL 165 12/11/2011   CHOL 157 10/07/2010   CHOL 153 04/24/2008   Lab Results  Component Value Date   HDL 50.10 12/11/2011   HDL 40.98 10/07/2010   HDL 11.9* 04/24/2008   Lab Results  Component Value Date   LDLCALC 99 12/11/2011   LDLCALC 89 10/07/2010   LDLCALC 94 04/24/2008   Lab Results  Component Value Date   TRIG 81.0 12/11/2011   TRIG 112.0 10/07/2010   TRIG 109 04/24/2008   Lab Results  Component Value Date   CHOLHDL 3 12/11/2011   CHOLHDL 3  10/07/2010   CHOLHDL 4.1 CALC 04/24/2008   No results found for this basename: LDLDIRECT     Zoster status - at [facility] within last 2 years   Had Tdap for skin tear - and ins would not pay - will look into that   mammo 4/12-needs it set up  [facility] Self exam -no lumps or changes   colonosc 8/10 Did not give her a a recall   Tdap is up to date   Pneumovax/ flu shots up to date  dexa was in 01-normal  Is on PPI No fractues Is very active and still working almost 40 hours per week  Takes her calcium and vit D for her bones   Pap nl in 07 Had hysterectomy- no gyn symptoms at all    Patient Active Problem List  Diagnoses  . CLOSTRIDIUM DIFFICILE COLITIS  . HYPERCHOLESTEROLEMIA, PURE  . HYPERTENSION, ESSENTIAL NOS  . ESOPHAGEAL STRICTURE  . IBS  . DEGENERATIVE DISC DISEASE  . BACK PAIN  .  ABNORMAL FINDINGS GI TRACT  . Other screening mammogram  . Osteoarthritis  . Preventative health care  . Skin tear   Past Medical History  Diagnosis Date  . Diverticulosis of colon   . Hyperlipidemia   . Hypertension   . Osteoarthritis   . Urinary incontinence   . IBS (irritable bowel syndrome)    Past Surgical History  Procedure Date  . Appendectomy     with infection (surgery x2)  . Abdominal hysterectomy   . Tonsillectomy   . Bladder surgery     bladder tack  . Foot neuroma surgery   . Breast surgery     breast cyst needle biopsy  . Breast cyst excision 08/2002    breast discharge, cyst removed  . Shoulder surgery     spur   History  Substance Use Topics  . Smoking status: Former Games developer  . Smokeless tobacco: Never Used  . Alcohol Use: No   Family History  Problem Relation Age of Onset  . Cancer Mother     kidney cancer  . Heart disease Father     MI  . Cancer Sister     lung  . Heart disease Sister     MI   Allergies  Allergen Reactions  . Amlodipine Besylate     REACTION: swelling  . Atorvastatin     REACTION: unspecified  .  Azithromycin     REACTION: nausea and vomiting  . Clarithromycin     REACTION: severe diarrhea  . Ibuprofen     REACTION: unspecified  . Levofloxacin     REACTION: diarrhea  . Piroxicam     REACTION: unspecified   Current Outpatient Prescriptions on File Prior to Visit  Medication Sig Dispense Refill  . hydrochlorothiazide (HYDRODIURIL) 25 MG tablet Take 1 tablet (25 mg total) by mouth daily.  90 tablet  3  . metoprolol tartrate (LOPRESSOR) 25 MG tablet Take 1 tablet (25 mg total) by mouth 2 (two) times daily.  180 tablet  3  . omeprazole (PRILOSEC) 20 MG capsule Take 1 capsule (20 mg total) by mouth daily.  90 capsule  3  . simvastatin (ZOCOR) 20 MG tablet Take 1 tablet (20 mg total) by mouth daily.  90 tablet  3  . valsartan-hydrochlorothiazide (DIOVAN-HCT) 160-12.5 MG per tablet Take 1 tablet by mouth daily.  90 tablet  3     Review of Systems Review of Systems  Constitutional: Negative for fever, appetite change, fatigue and unexpected weight change.  Eyes: Negative for pain and visual disturbance.  Respiratory: Negative for cough and shortness of breath.   Cardiovascular: Negative for cp or palpitations    Gastrointestinal: Negative for nausea, diarrhea and constipation.  Genitourinary: Negative for urgency and frequency.  Skin: Negative for pallor or rash   MSK pos for joint pain from arthritis  Neurological: Negative for weakness, light-headedness, numbness and headaches.  Hematological: Negative for adenopathy. Does not bruise/bleed easily.  Psychiatric/Behavioral: Negative for dysphoric mood. The patient is not nervous/anxious.         Objective:   Physical Exam  Constitutional: She appears well-developed and well-nourished. No distress.  HENT:  Head: Normocephalic and atraumatic.  Mouth/Throat: Oropharynx is clear and moist.  Eyes: Conjunctivae and EOM are normal. Pupils are equal, round, and reactive to light. No scleral icterus.  Neck: Normal range of motion.  Neck supple. No JVD present. Carotid bruit is not present. No thyromegaly present.  Cardiovascular: Normal rate, regular rhythm, normal heart sounds and intact  distal pulses.  Exam reveals no gallop.   No murmur heard. Pulmonary/Chest: Effort normal and breath sounds normal. No respiratory distress. She has no wheezes.  Abdominal: Soft. Bowel sounds are normal. She exhibits no distension, no abdominal bruit and no mass. There is no tenderness.  Genitourinary: No breast swelling, tenderness, discharge or bleeding.       Breast exam: No mass, nodules, thickening, tenderness, bulging, retraction, inflamation, nipple discharge or skin changes noted.  No axillary or clavicular LA.  Chaperoned exam.    Musculoskeletal: Normal range of motion. She exhibits no edema and no tenderness.  Lymphadenopathy:    She has no cervical adenopathy.  Neurological: She is alert. No cranial nerve deficit. She exhibits normal muscle tone. Coordination normal.  Skin: Skin is warm and dry. No rash noted. No erythema. No pallor.  Psychiatric: She has a normal mood and affect.          Assessment & Plan:

## 2011-12-13 NOTE — Assessment & Plan Note (Signed)
Disc goals for lipids and reasons to control them Rev labs with pt Rev low sat fat diet in detail Well controlled with diet and zocor-no changes

## 2011-12-13 NOTE — Assessment & Plan Note (Signed)
Scheduled annual screening mammogram Nl breast exam today  Encouraged monthly self exams   

## 2011-12-13 NOTE — Assessment & Plan Note (Signed)
bp in fair control at this time  No changes needed  Disc lifstyle change with low sodium diet and exercise  Refilled medicines Labs reviewed

## 2011-12-13 NOTE — Patient Instructions (Signed)
Please call your insurance to see if a bone density test would be covered (dexa)- and let us know We will schedule mammogram at check out  Talk to the up front staff about the billing issue for the tetanus shot  Continue your calcium and vitamin D Stay active

## 2011-12-15 ENCOUNTER — Encounter: Payer: Self-pay | Admitting: *Deleted

## 2012-01-23 ENCOUNTER — Ambulatory Visit: Payer: Self-pay | Admitting: Family Medicine

## 2012-01-24 ENCOUNTER — Encounter: Payer: Self-pay | Admitting: Family Medicine

## 2012-02-22 ENCOUNTER — Telehealth: Payer: Self-pay | Admitting: Family Medicine

## 2012-02-22 NOTE — Telephone Encounter (Signed)
Tetanus shot was not covered and question if flu was billed.  Was Tetanus billed twice and denied and the missed charge on the flu?

## 2012-03-18 ENCOUNTER — Other Ambulatory Visit: Payer: Self-pay | Admitting: *Deleted

## 2012-03-18 MED ORDER — TRAMADOL HCL 50 MG PO TABS: 50.0000 mg | ORAL_TABLET | Freq: Three times a day (TID) | ORAL | Status: DC | PRN

## 2012-03-18 NOTE — Telephone Encounter (Signed)
Phoned refill request from patient.  She says [facility] has been faxing request but we haven't responded.

## 2012-03-18 NOTE — Telephone Encounter (Signed)
I have not seen any requests - let her know that  Will refill electronically

## 2012-03-23 ENCOUNTER — Other Ambulatory Visit: Payer: Self-pay | Admitting: Family Medicine

## 2012-03-26 NOTE — Telephone Encounter (Signed)
Will refill electronically  

## 2012-05-28 ENCOUNTER — Other Ambulatory Visit: Payer: Self-pay | Admitting: Family Medicine

## 2012-05-28 NOTE — Telephone Encounter (Signed)
Ok to refill 

## 2012-05-28 NOTE — Telephone Encounter (Signed)
Refilled electronically 

## 2012-05-31 ENCOUNTER — Other Ambulatory Visit: Payer: Self-pay

## 2012-05-31 NOTE — Telephone Encounter (Signed)
Pt left v/m that she dropped tramadol in the sink and needed refill; spoke with Katie at [facility] [address] Tramadol rx is ready for pick up. Pt already had refills and was not too early to refill. Pt notified.

## 2012-07-24 ENCOUNTER — Other Ambulatory Visit: Payer: Self-pay | Admitting: Family Medicine

## 2012-07-25 NOTE — Telephone Encounter (Signed)
Please give her 3 mo of refils, thanks

## 2012-07-25 NOTE — Telephone Encounter (Signed)
Ok to refill 

## 2012-07-26 ENCOUNTER — Other Ambulatory Visit: Payer: Self-pay

## 2012-07-26 NOTE — Telephone Encounter (Signed)
Pt left v/m to see status of tramadol refill. Spoke with Florentina Addison at [facility] [address] and rx ready for pick up. Left pt v/m med ready for pick up.

## 2012-09-20 ENCOUNTER — Other Ambulatory Visit: Payer: Self-pay | Admitting: Family Medicine

## 2012-09-20 NOTE — Telephone Encounter (Signed)
Please give her 3 refils/thanks

## 2012-09-20 NOTE — Telephone Encounter (Signed)
Ok to refill 

## 2012-09-20 NOTE — Telephone Encounter (Signed)
done

## 2012-11-16 ENCOUNTER — Ambulatory Visit: Payer: Self-pay | Admitting: Emergency Medicine

## 2012-11-16 LAB — CBC
HCT: 40.6 % (ref 35.0–47.0)
HGB: 13.7 g/dL (ref 12.0–16.0)
MCH: 30.9 pg (ref 26.0–34.0)
MCHC: 33.6 g/dL (ref 32.0–36.0)
RBC: 4.42 10*6/uL (ref 3.80–5.20)

## 2012-11-16 LAB — BASIC METABOLIC PANEL
Anion Gap: 6 — ABNORMAL LOW (ref 7–16)
Calcium, Total: 9.7 mg/dL (ref 8.5–10.1)
Chloride: 104 mmol/L (ref 98–107)
Co2: 31 mmol/L (ref 21–32)
Creatinine: 0.86 mg/dL (ref 0.60–1.30)
EGFR (African American): >60
EGFR (Non-African Amer.): >60
Glucose: 102 mg/dL — ABNORMAL HIGH (ref 65–99)
Osmolality: 284 (ref 275–301)
Potassium: 3.5 mmol/L (ref 3.5–5.1)
Sodium: 141 mmol/L (ref 136–145)

## 2012-11-16 LAB — PROTIME-INR: INR: 1

## 2012-12-09 ENCOUNTER — Ambulatory Visit: Payer: Self-pay | Admitting: Gastroenterology

## 2012-12-20 ENCOUNTER — Encounter: Payer: Self-pay | Admitting: Internal Medicine

## 2012-12-20 ENCOUNTER — Other Ambulatory Visit: Payer: Self-pay | Admitting: Gastroenterology

## 2012-12-20 ENCOUNTER — Ambulatory Visit (INDEPENDENT_AMBULATORY_CARE_PROVIDER_SITE_OTHER): Payer: Medicare Other | Admitting: Internal Medicine

## 2012-12-20 ENCOUNTER — Encounter: Payer: Self-pay | Admitting: Family Medicine

## 2012-12-20 ENCOUNTER — Telehealth: Payer: Self-pay | Admitting: Internal Medicine

## 2012-12-20 VITALS — BP 130/60 | HR 54 | Temp 98.3°F | Wt 120.0 lb

## 2012-12-20 DIAGNOSIS — R001 Bradycardia, unspecified: Secondary | ICD-10-CM

## 2012-12-20 DIAGNOSIS — I498 Other specified cardiac arrhythmias: Secondary | ICD-10-CM

## 2012-12-20 DIAGNOSIS — R031 Nonspecific low blood-pressure reading: Secondary | ICD-10-CM

## 2012-12-20 NOTE — Progress Notes (Signed)
Subjective:    Patient ID: Rachel Knox, female    DOB: [birth year], 79 y.o.   MRN: [id number]  HPI Here with daughter Rachel Knox  See phone note with Dr Marva Panda Was at [facility] ready to have EGD for recurrent food impaction Was noted to have severe sinus bradycardia Had been fasting Did take AM metoprolol  Has been feeling a little tired--but only intermittent No palpitations No dizziness  No syncope or falls No chest pain other than what she attributed to indigestion No edema  Sleeps on 1 pillow No PND No change in exercise tolerance--- still lifts 15-20# weights at work repetitively (still full time)  Current Outpatient Prescriptions on File Prior to Visit  Medication Sig Dispense Refill  . hydrochlorothiazide (HYDRODIURIL) 25 MG tablet Take 1 tablet (25 mg total) by mouth daily.  90 tablet  3  . omeprazole (PRILOSEC) 20 MG capsule Take 1 capsule (20 mg total) by mouth daily.  90 capsule  3  . simvastatin (ZOCOR) 20 MG tablet Take 1 tablet (20 mg total) by mouth daily.  90 tablet  3  . traMADol (ULTRAM) 50 MG tablet TAKE ONE TO TWO TABLETS BY MOUTH EVERY 8 HOURS AS NEEDED  30 tablet  3  . valsartan-hydrochlorothiazide (DIOVAN-HCT) 160-12.5 MG per tablet Take 1 tablet by mouth daily.  90 tablet  3   No current facility-administered medications on file prior to visit.    Allergies  Allergen Reactions  . Amlodipine Besylate     REACTION: swelling  . Atorvastatin     REACTION: unspecified  . Azithromycin     REACTION: nausea and vomiting  . Clarithromycin     REACTION: severe diarrhea  . Ibuprofen     REACTION: unspecified  . Levofloxacin     REACTION: diarrhea  . Piroxicam     REACTION: unspecified    Past Medical History  Diagnosis Date  . Diverticulosis of colon   . Hyperlipidemia   . Hypertension   . Osteoarthritis   . Urinary incontinence   . IBS (irritable bowel syndrome)     Past Surgical History  Procedure Laterality Date  . Appendectomy      with  infection (surgery x2)  . Abdominal hysterectomy    . Tonsillectomy    . Bladder surgery      bladder tack  . Foot neuroma surgery    . Breast surgery      breast cyst needle biopsy  . Breast cyst excision  08/2002    breast discharge, cyst removed  . Shoulder surgery      spur    Family History  Problem Relation Age of Onset  . Cancer Mother     kidney cancer  . Heart disease Father     MI  . Cancer Sister     lung  . Heart disease Sister     MI    History   Social History  . Marital Status: Divorced    Spouse Name: N/A    Number of Children: N/A  . Years of Education: N/A   Occupational History  . Not on file.   Social History Main Topics  . Smoking status: Former Games developer  . Smokeless tobacco: Never Used  . Alcohol Use: No  . Drug Use: No  . Sexually Active: Not on file   Other Topics Concern  . Not on file   Social History Narrative  . No narrative on file   Review of Systems Appetite is fine Weight  is down about 10# since esophagus problems and some stress    Objective:   Physical Exam  Constitutional: She appears well-developed and well-nourished. No distress.  Neck: Normal range of motion. Neck supple. No thyromegaly present.  Cardiovascular: Regular rhythm and normal heart sounds.  Exam reveals no gallop.   No murmur heard. Heart rate now in 40's  Pulmonary/Chest: Effort normal and breath sounds normal. No respiratory distress. She has no wheezes. She has no rales.  Abdominal: Soft. There is no tenderness.  Musculoskeletal: She exhibits no edema and no tenderness.  Lymphadenopathy:    She has no cervical adenopathy.  Psychiatric: She has a normal mood and affect. Her behavior is normal.          Assessment & Plan:

## 2012-12-20 NOTE — Assessment & Plan Note (Signed)
Asymptomatic though documented at 65 at [facility] and in 40's now No CHF  No reason for further evaluation unless this persists off the beta blocker Will stop metoprolol Follow up next week---if HR up, can reschedule the EGD then

## 2012-12-20 NOTE — Telephone Encounter (Signed)
Call from Dr Marva Panda Having recurrent meat impactions Wanted to dilate her but noted asymptomatic bradycardia around 36 No dizziness, chest pain or SOB EKG shows sinus rhythm with arrhythmia (but not block according to him)  Will see her at 3PM today Hold the metoprolol

## 2012-12-20 NOTE — Patient Instructions (Signed)
Please stop the metoprolol. 

## 2012-12-25 ENCOUNTER — Ambulatory Visit (INDEPENDENT_AMBULATORY_CARE_PROVIDER_SITE_OTHER): Payer: Medicare Other | Admitting: Family Medicine

## 2012-12-25 ENCOUNTER — Encounter: Payer: Self-pay | Admitting: Family Medicine

## 2012-12-25 VITALS — BP 142/70 | HR 61 | Temp 98.4°F | Ht 64.0 in | Wt 120.0 lb

## 2012-12-25 DIAGNOSIS — I10 Essential (primary) hypertension: Secondary | ICD-10-CM

## 2012-12-25 DIAGNOSIS — E7800 Pure hypercholesterolemia, unspecified: Secondary | ICD-10-CM

## 2012-12-25 DIAGNOSIS — K222 Esophageal obstruction: Secondary | ICD-10-CM

## 2012-12-25 LAB — LIPID PANEL
Cholesterol: 193 mg/dL (ref 0–200)
LDL Cholesterol: 114 mg/dL — ABNORMAL HIGH (ref 0–99)
Total CHOL/HDL Ratio: 4
Triglycerides: 155 mg/dL — ABNORMAL HIGH (ref 0.0–149.0)
VLDL: 31 mg/dL (ref 0.0–40.0)

## 2012-12-25 LAB — COMPREHENSIVE METABOLIC PANEL
ALT: 16 U/L (ref 0–35)
AST: 22 U/L (ref 0–37)
Albumin: 4 g/dL (ref 3.5–5.2)
Alkaline Phosphatase: 52 U/L (ref 39–117)
Calcium: 9.2 mg/dL (ref 8.4–10.5)
Chloride: 102 mEq/L (ref 96–112)
Potassium: 4.2 mEq/L (ref 3.5–5.1)

## 2012-12-25 LAB — CBC WITH DIFFERENTIAL/PLATELET
Basophils Absolute: 0 10*3/uL (ref 0.0–0.1)
Eosinophils Absolute: 0.1 10*3/uL (ref 0.0–0.7)
Lymphocytes Relative: 37.1 % (ref 12.0–46.0)
MCHC: 34 g/dL (ref 30.0–36.0)
MCV: 93 fl (ref 78.0–100.0)
Monocytes Absolute: 0.5 10*3/uL (ref 0.1–1.0)
Neutrophils Relative %: 54.6 % (ref 43.0–77.0)
Platelets: 155 10*3/uL (ref 150.0–400.0)
RDW: 12.9 % (ref 11.5–14.6)

## 2012-12-25 NOTE — Assessment & Plan Note (Signed)
Lab today Controlled by diet and zocor  Rev low sat fat diet

## 2012-12-25 NOTE — Patient Instructions (Addendum)
Your pulse rate is better now - off the metoprolol Continue current medicines  See Dr Marva Panda as planned  Labs today

## 2012-12-25 NOTE — Assessment & Plan Note (Signed)
bp in fair control at this time  No changes needed  Disc lifstyle change with low sodium diet and exercise  Off metoprolol due to bradycardia and doing fine without it  Pulse over 60- so can have her EGD with dilation -Dr Marva Panda

## 2012-12-25 NOTE — Progress Notes (Signed)
Subjective:    Patient ID: Rachel Knox, female    DOB: [birth year], 79 y.o.   MRN: [id number]  HPI Here for f/u of chronic health problems   She is also having esophagus problems 2 weeks ago - ate a roast beef sandwhich and it got stuck She has a stricture - and is getting choked more and more easily  She saw GI- and has plan to have it dilated - upcoming  She is on prilosec  Saw Dr Marva Panda- stopped one of her bp medicines Now pulse is normal in 60s    Had the shingles shot at [facility]- happy to get that done   Wt is down 6 lb with bmi of 20  bp is stable today  No cp or palpitations or headaches or edema  No side effects to medicines  BP Readings from Last 3 Encounters:  12/25/12 142/70  12/20/12 130/60  12/13/11 138/60     Hyperlipidemia Lab Results  Component Value Date   CHOL 165 12/11/2011   HDL 50.10 12/11/2011   LDLCALC 99 12/11/2011   TRIG 81.0 12/11/2011   CHOLHDL 3 12/11/2011   Due for labs On simvastatin   Patient Active Problem List   Diagnosis Date Noted  . Sinus bradycardia 12/20/2012  . Other screening mammogram 10/12/2010  . Osteoarthritis 10/12/2010  . Preventative health care 10/12/2010  . BACK PAIN 09/29/2009  . ABNORMAL FINDINGS GI TRACT 12/10/2008  . HYPERCHOLESTEROLEMIA, PURE 02/01/2007  . HYPERTENSION, ESSENTIAL NOS 02/01/2007  . CLOSTRIDIUM DIFFICILE COLITIS 01/22/2007  . ESOPHAGEAL STRICTURE 01/22/2007  . IBS 01/22/2007  . DEGENERATIVE DISC DISEASE 01/22/2007   Past Medical History  Diagnosis Date  . Diverticulosis of colon   . Hyperlipidemia   . Hypertension   . Osteoarthritis   . Urinary incontinence   . IBS (irritable bowel syndrome)    Past Surgical History  Procedure Laterality Date  . Appendectomy      with infection (surgery x2)  . Abdominal hysterectomy    . Tonsillectomy    . Bladder surgery      bladder tack  . Foot neuroma surgery    . Breast surgery      breast cyst needle biopsy  . Breast cyst excision   08/2002    breast discharge, cyst removed  . Shoulder surgery      spur   History  Substance Use Topics  . Smoking status: Former Games developer  . Smokeless tobacco: Never Used  . Alcohol Use: No   Family History  Problem Relation Age of Onset  . Cancer Mother     kidney cancer  . Heart disease Father     MI  . Cancer Sister     lung  . Heart disease Sister     MI   Allergies  Allergen Reactions  . Amlodipine Besylate     REACTION: swelling  . Atorvastatin     REACTION: unspecified  . Azithromycin     REACTION: nausea and vomiting  . Clarithromycin     REACTION: severe diarrhea  . Ibuprofen     REACTION: unspecified  . Levofloxacin     REACTION: diarrhea  . Piroxicam     REACTION: unspecified  . Metoprolol     Asymptomatic but severe bradycardia   Current Outpatient Prescriptions on File Prior to Visit  Medication Sig Dispense Refill  . hydrochlorothiazide (HYDRODIURIL) 25 MG tablet Take 1 tablet (25 mg total) by mouth daily.  90 tablet  3  . omeprazole (  PRILOSEC) 20 MG capsule Take 1 capsule (20 mg total) by mouth daily.  90 capsule  3  . simvastatin (ZOCOR) 20 MG tablet Take 1 tablet (20 mg total) by mouth daily.  90 tablet  3  . traMADol (ULTRAM) 50 MG tablet TAKE ONE TO TWO TABLETS BY MOUTH EVERY 8 HOURS AS NEEDED  30 tablet  3  . valsartan-hydrochlorothiazide (DIOVAN-HCT) 160-12.5 MG per tablet Take 1 tablet by mouth daily.  90 tablet  3   No current facility-administered medications on file prior to visit.    Review of Systems Review of Systems  Constitutional: Negative for fever, appetite change, fatigue and unexpected weight change.  Eyes: Negative for pain and visual disturbance.  ENT neg for st or ear pain  Respiratory: Negative for cough and shortness of breath.   Cardiovascular: Negative for cp or palpitations    Gastrointestinal: Negative for nausea, diarrhea and constipation. pos for problems swallowing, neg for heartburn Genitourinary: Negative for  urgency and frequency.  Skin: Negative for pallor or rash   Neurological: Negative for weakness, light-headedness, numbness and headaches.  Hematological: Negative for adenopathy. Does not bruise/bleed easily.  Psychiatric/Behavioral: Negative for dysphoric mood. The patient is not nervous/anxious.         Objective:   Physical Exam  Constitutional: She appears well-developed and well-nourished. No distress.  HENT:  Head: Normocephalic and atraumatic.  Mouth/Throat: Oropharynx is clear and moist.  Eyes: Conjunctivae and EOM are normal. Pupils are equal, round, and reactive to light. Right eye exhibits no discharge. Left eye exhibits no discharge. No scleral icterus.  Neck: Normal range of motion. Neck supple. No JVD present. Carotid bruit is not present. No thyromegaly present.  Cardiovascular: Normal rate and regular rhythm.   Pulmonary/Chest: Effort normal and breath sounds normal. No respiratory distress. She has no wheezes.  Abdominal: Soft. Bowel sounds are normal. She exhibits no distension, no abdominal bruit and no mass. There is no tenderness.  Musculoskeletal: She exhibits no edema and no tenderness.  Lymphadenopathy:    She has no cervical adenopathy.  Neurological: She has normal reflexes. No cranial nerve deficit. She exhibits normal muscle tone. Coordination normal.  Skin: Skin is warm and dry. No rash noted. No erythema. No pallor.  Psychiatric: She has a normal mood and affect.          Assessment & Plan:

## 2012-12-26 ENCOUNTER — Encounter: Payer: Self-pay | Admitting: *Deleted

## 2012-12-26 NOTE — Assessment & Plan Note (Signed)
For EGD and dilation soon with GI- now she is safe for the procedure without bradycardia

## 2012-12-27 ENCOUNTER — Encounter: Payer: Self-pay | Admitting: Gastroenterology

## 2013-01-13 ENCOUNTER — Other Ambulatory Visit: Payer: Self-pay

## 2013-01-13 MED ORDER — VALSARTAN-HYDROCHLOROTHIAZIDE 160-12.5 MG PO TABS: 1.0000 | ORAL_TABLET | Freq: Every day | ORAL | Status: DC

## 2013-01-13 NOTE — Telephone Encounter (Signed)
Pt left v/m requesting refill valsartan HCTZ to [place] [facility]. Advised Annalisse holler done.

## 2013-01-30 ENCOUNTER — Ambulatory Visit: Payer: Self-pay | Admitting: Unknown Physician Specialty

## 2013-01-30 LAB — BASIC METABOLIC PANEL
BUN: 19 mg/dL — ABNORMAL HIGH (ref 7–18)
Calcium, Total: 9.8 mg/dL (ref 8.5–10.1)
Co2: 31 mmol/L (ref 21–32)
EGFR (African American): 54 — ABNORMAL LOW
EGFR (Non-African Amer.): 47 — ABNORMAL LOW
Glucose: 94 mg/dL (ref 65–99)
Osmolality: 281 (ref 275–301)
Potassium: 3.7 mmol/L (ref 3.5–5.1)
Sodium: 140 mmol/L (ref 136–145)

## 2013-01-30 LAB — CBC
HCT: 38.2 % (ref 35.0–47.0)
HGB: 13.4 g/dL (ref 12.0–16.0)
MCH: 32 pg (ref 26.0–34.0)
MCHC: 35 g/dL (ref 32.0–36.0)
MCV: 92 fL (ref 80–100)
RBC: 4.17 10*6/uL (ref 3.80–5.20)
RDW: 12.9 % (ref 11.5–14.5)
WBC: 6.2 10*3/uL (ref 3.6–11.0)

## 2013-01-31 ENCOUNTER — Other Ambulatory Visit: Payer: Self-pay

## 2013-01-31 NOTE — Telephone Encounter (Signed)
Pt thinks she washed her Tramadol rx. Spoke with [facility] [address] pt does not have refills on Tramadol; last refill on 11/10/12. Pt request refill to [facility] [address]Please advise.

## 2013-01-31 NOTE — Telephone Encounter (Signed)
Please ask the pharmacist at [facility] if he/she can check the registry to prove she has not had this filled at any other pharmacy since the last fill date you gave me (I highly doubt it )- and if not, I will refill it

## 2013-01-31 NOTE — Telephone Encounter (Signed)
Called pharmacy twice and no answer, so I left voicemail requesting the pharmacy to check the registry before we prescribed Tramadol

## 2013-02-03 NOTE — Telephone Encounter (Signed)
Ok- please give her one refill on that -thanks

## 2013-02-03 NOTE — Telephone Encounter (Signed)
Spoke with Pharmacist and they check the registry and no other refills were on the registry except the Rx's they prescribed, please advise

## 2013-02-03 NOTE — Telephone Encounter (Signed)
Let me know when you hear back, thanks

## 2013-02-04 MED ORDER — TRAMADOL HCL 50 MG PO TABS: ORAL_TABLET | ORAL | Status: DC

## 2013-02-04 NOTE — Telephone Encounter (Signed)
Rx sent to pharmacy, and left voicemail letting pt know Rx sent to pharmacy

## 2013-02-12 ENCOUNTER — Encounter: Payer: Self-pay | Admitting: Family Medicine

## 2013-02-14 ENCOUNTER — Ambulatory Visit: Payer: Self-pay | Admitting: Gastroenterology

## 2013-02-24 ENCOUNTER — Encounter: Payer: Self-pay | Admitting: Family Medicine

## 2013-03-11 ENCOUNTER — Other Ambulatory Visit: Payer: Self-pay | Admitting: *Deleted

## 2013-03-11 MED ORDER — VALSARTAN-HYDROCHLOROTHIAZIDE 160-12.5 MG PO TABS: 1.0000 | ORAL_TABLET | Freq: Every day | ORAL | Status: DC

## 2013-03-19 ENCOUNTER — Other Ambulatory Visit: Payer: Self-pay

## 2013-03-19 MED ORDER — TRAMADOL HCL 50 MG PO TABS: ORAL_TABLET | ORAL | Status: DC

## 2013-03-19 NOTE — Telephone Encounter (Signed)
Pt left v/m requesting status of refill for tramadol; pt thought request was sent in first of the week; spoke with [facility] [address] and request should have come to our office on 03/16/13; advised did not reveive and sent request to Dr Milinda Antis.Please advise.

## 2013-03-19 NOTE — Telephone Encounter (Signed)
Px written for call in   

## 2013-03-19 NOTE — Telephone Encounter (Signed)
Rx called in as prescribed, and pt notified Rx sent

## 2013-03-21 ENCOUNTER — Other Ambulatory Visit: Payer: Self-pay | Admitting: *Deleted

## 2013-04-07 ENCOUNTER — Emergency Department: Payer: Self-pay | Admitting: Emergency Medicine

## 2013-04-07 LAB — COMPREHENSIVE METABOLIC PANEL
Alkaline Phosphatase: 78 U/L (ref 50–136)
Anion Gap: 5 — ABNORMAL LOW (ref 7–16)
BUN: 22 mg/dL — ABNORMAL HIGH (ref 7–18)
Bilirubin,Total: 0.7 mg/dL (ref 0.2–1.0)
Calcium, Total: 9.9 mg/dL (ref 8.5–10.1)
Chloride: 101 mmol/L (ref 98–107)
Co2: 33 mmol/L — ABNORMAL HIGH (ref 21–32)
Creatinine: 1.02 mg/dL (ref 0.60–1.30)
EGFR (Non-African Amer.): 51 — ABNORMAL LOW
Glucose: 80 mg/dL (ref 65–99)
Potassium: 3.9 mmol/L (ref 3.5–5.1)
Sodium: 139 mmol/L (ref 136–145)
Total Protein: 7.5 g/dL (ref 6.4–8.2)

## 2013-04-07 LAB — CBC WITH DIFFERENTIAL/PLATELET
Basophil #: 0 10*3/uL (ref 0.0–0.1)
Eosinophil %: 0.8 %
MCH: 31.8 pg (ref 26.0–34.0)
MCHC: 34.4 g/dL (ref 32.0–36.0)
Monocyte #: 0.4 x10 3/mm (ref 0.2–0.9)
Monocyte %: 6 %
Neutrophil %: 68.4 %
Platelet: 204 10*3/uL (ref 150–440)
RBC: 4.16 10*6/uL (ref 3.80–5.20)

## 2013-04-23 ENCOUNTER — Ambulatory Visit: Payer: Self-pay | Admitting: Gastroenterology

## 2013-05-02 ENCOUNTER — Telehealth: Payer: Self-pay | Admitting: Family Medicine

## 2013-05-02 NOTE — Telephone Encounter (Signed)
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

## 2013-05-18 ENCOUNTER — Ambulatory Visit: Payer: Self-pay | Admitting: Gastroenterology

## 2013-05-18 LAB — CBC
HGB: 12.7 g/dL (ref 12.0–16.0)
MCHC: 34.4 g/dL (ref 32.0–36.0)
Platelet: 225 10*3/uL (ref 150–440)
RDW: 12.7 % (ref 11.5–14.5)
WBC: 7.2 10*3/uL (ref 3.6–11.0)

## 2013-05-18 LAB — COMPREHENSIVE METABOLIC PANEL
Alkaline Phosphatase: 72 U/L (ref 50–136)
BUN: 18 mg/dL (ref 7–18)
Bilirubin,Total: 0.3 mg/dL (ref 0.2–1.0)
Chloride: 99 mmol/L (ref 98–107)
Co2: 31 mmol/L (ref 21–32)
Creatinine: 1.11 mg/dL (ref 0.60–1.30)
EGFR (African American): 53 — ABNORMAL LOW
Glucose: 82 mg/dL (ref 65–99)
Osmolality: 275 (ref 275–301)
SGPT (ALT): 27 U/L (ref 12–78)
Sodium: 137 mmol/L (ref 136–145)

## 2013-05-22 ENCOUNTER — Other Ambulatory Visit: Payer: Self-pay | Admitting: *Deleted

## 2013-05-22 MED ORDER — HYDROCHLOROTHIAZIDE 25 MG PO TABS: 25.0000 mg | ORAL_TABLET | Freq: Every day | ORAL | Status: DC

## 2013-05-22 MED ORDER — SIMVASTATIN 20 MG PO TABS: 20.0000 mg | ORAL_TABLET | Freq: Every day | ORAL | Status: DC

## 2013-05-22 NOTE — Addendum Note (Signed)
Addended by: Shon Millet on: 05/22/2013 02:55 PM   Modules accepted: Orders

## 2013-05-23 NOTE — Telephone Encounter (Signed)
Pt checking on status of HCTZ refill; advised done on 05/22/13 to [facility] pharmacy; pt will ck with pharmacy.

## 2013-06-27 ENCOUNTER — Encounter: Payer: Self-pay | Admitting: Family Medicine

## 2013-06-27 ENCOUNTER — Ambulatory Visit (INDEPENDENT_AMBULATORY_CARE_PROVIDER_SITE_OTHER): Payer: Medicare Other | Admitting: Family Medicine

## 2013-06-27 VITALS — BP 142/62 | HR 62 | Temp 97.7°F | Ht 65.0 in | Wt 117.8 lb

## 2013-06-27 DIAGNOSIS — K222 Esophageal obstruction: Secondary | ICD-10-CM

## 2013-06-27 DIAGNOSIS — R413 Other amnesia: Secondary | ICD-10-CM | POA: Insufficient documentation

## 2013-06-27 DIAGNOSIS — F430 Acute stress reaction: Secondary | ICD-10-CM

## 2013-06-27 DIAGNOSIS — R634 Abnormal weight loss: Secondary | ICD-10-CM | POA: Insufficient documentation

## 2013-06-27 LAB — CBC WITH DIFFERENTIAL/PLATELET
Basophils Relative: 0.4 % (ref 0.0–3.0)
Eosinophils Absolute: 0 10*3/uL (ref 0.0–0.7)
Eosinophils Relative: 0.7 % (ref 0.0–5.0)
HCT: 35.7 % — ABNORMAL LOW (ref 36.0–46.0)
Lymphs Abs: 2 10*3/uL (ref 0.7–4.0)
MCHC: 33.1 g/dL (ref 30.0–36.0)
MCV: 94 fl (ref 78.0–100.0)
Monocytes Absolute: 0.4 10*3/uL (ref 0.1–1.0)
Platelets: 213 10*3/uL (ref 150.0–400.0)
WBC: 7 10*3/uL (ref 4.5–10.5)

## 2013-06-27 NOTE — Patient Instructions (Signed)
Some labs today for memory problems (I am checking B12 , folate and blood count) Try to get 3 meals per day with snacks to keep from loosing weight  Get your endoscopy as planned  Follow up in 6 months for annual exam with labs prior

## 2013-06-27 NOTE — Progress Notes (Signed)
Subjective:    Patient ID: Rachel Knox, female    DOB: 1927/08/02, 79 y.o.   MRN: [id number]  HPI Here to discuss memory issues  Her daughter tells her about things she did - usually very recent- that she forgets about (like putting something in the fridge) Not misplacing items- remembers that well  Does not forget names and numbers - does put appts on her calender- she is very organized Not forgetting medicines - very good about that  Does not find herself getting confused Has not become lost  Does not leave appliances running  Still drives -no accidents and never had a ticket   Daughter is "overprotective" and worries about her   Does not tend to repeat herself frequently and does not forget phone calls    Has had lot of esophagus problems over time - and will have EGD with Dr Marva Panda on Jan 9th - and  Will dilate the stricture - has had a lot of problems  Has "jackhammer" esophagus Cannot take ca channel blocker due to bradycardia  Tried baclofen    Lost 3 more lb Also 20 in past 2 years  She thinks this is due to both stress and esophagus problems    Most of her stress has to do with family issues  Daughter and grandchild moved in with her after the husband left -- has been a while  They occasionally disagree , and she gets upset if she is rushed doing things  She no longer has her space , but also enjoys it to a certain extent and feels secure in her being there   She still works/ holds a job - 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     Chemistry      Component Value Date/Time   NA 140 12/25/2012 1506   K 4.2 12/25/2012 1506   CL 102 12/25/2012 1506   CO2 29 12/25/2012 1506   BUN 22 12/25/2012 1506   CREATININE 1.0 12/25/2012 1506      Component Value Date/Time   CALCIUM 9.2 12/25/2012 1506   ALKPHOS 52 12/25/2012 1506   AST 22 12/25/2012 1506   ALT 16 12/25/2012 1506   BILITOT 0.6 12/25/2012 1506        Lab Results  Component Value Date   WBC 7.5 12/25/2012   HGB 11.7* 12/25/2012   HCT 34.4* 12/25/2012   MCV 93.0 12/25/2012   PLT 155.0 12/25/2012    Lab Results  Component Value Date   TSH 1.13 12/25/2012     Sallye Ober - is an alarmist at times Saralyn Pilar is more level headed - daughter that lives with her    Review of Systems     Objective:   Physical Exam  Constitutional: She appears well-developed and well-nourished. No distress.  Slim and well appearing   HENT:  Head: Normocephalic and atraumatic.  Right Ear: External ear normal.  Left Ear: External ear normal.  Mouth/Throat: Oropharynx is clear and moist.  Eyes: Conjunctivae and EOM are normal. Pupils are equal, round, and reactive to light. No scleral icterus.  Neck: Normal range of motion. Neck supple. No JVD present. Carotid bruit is not present. No thyromegaly present.  Cardiovascular: Normal rate, regular rhythm, normal heart sounds and intact distal pulses.  Exam reveals no gallop.   Pulmonary/Chest: Effort normal and breath sounds normal. No respiratory distress. She has no wheezes. She exhibits no tenderness.  Abdominal: Soft. Bowel sounds are normal. She exhibits no distension, no abdominal bruit and no mass. There is no tenderness.  Musculoskeletal: Normal range of motion. She exhibits no edema and no tenderness.  Lymphadenopathy:    She has no cervical adenopathy.  Neurological: She is alert. She has normal reflexes. No cranial nerve deficit. She exhibits normal muscle tone. Coordination normal.  Skin: Skin is warm and dry. No rash noted. No erythema. No pallor.  Psychiatric: She has a normal mood and affect.  Talkative and seemingly very mentally sharp Not anxious or depressed           Assessment & Plan:

## 2013-06-27 NOTE — Progress Notes (Signed)
Pre-visit discussion using our clinic review tool. No additional management support is needed unless otherwise documented below in the visit note.  

## 2013-06-29 NOTE — Assessment & Plan Note (Signed)
Pt thinks this is due to both stressors and swallowing problems Will watch carefully  Disc need for 3 meals per day with snacks - use of supplements is ok

## 2013-06-29 NOTE — Assessment & Plan Note (Addendum)
Pt seems mentally sharp today and still works part time Stressors may play a role-disc this in detail and pt thinks she has god coping skills overall Rev hx in detail  Has good recall and cognition today Lab for memory- B12 and folate today  Will continue to follow

## 2013-06-29 NOTE — Assessment & Plan Note (Signed)
Long disc about stressors at home  Reviewed stressors/ coping techniques/symptoms/ support sources/ tx options and side effects in detail today  She thinks she deals well and declines counseling or other tx  Mood remains positive overall  Will call if symptoms worsen

## 2013-06-29 NOTE — Assessment & Plan Note (Signed)
For f/u and EGD/ esoph dilatation  Hopes this will help  Cannot take ca channel blocker due to bradycardia  Has dx of jackhammer esophagus

## 2013-07-01 ENCOUNTER — Ambulatory Visit: Payer: Self-pay | Admitting: Unknown Physician Specialty

## 2013-07-01 LAB — COMPREHENSIVE METABOLIC PANEL
Albumin: 4.3 g/dL (ref 3.4–5.0)
Bilirubin,Total: 0.4 mg/dL (ref 0.2–1.0)
Calcium, Total: 9.8 mg/dL (ref 8.5–10.1)
Chloride: 101 mmol/L (ref 98–107)
EGFR (African American): 52 — ABNORMAL LOW
Glucose: 91 mg/dL (ref 65–99)
Osmolality: 275 (ref 275–301)
Potassium: 3.7 mmol/L (ref 3.5–5.1)
SGOT(AST): 27 U/L (ref 15–37)
SGPT (ALT): 24 U/L (ref 12–78)
Total Protein: 7.6 g/dL (ref 6.4–8.2)

## 2013-07-01 LAB — CBC
HCT: 38.6 % (ref 35.0–47.0)
HGB: 12.9 g/dL (ref 12.0–16.0)
MCH: 30.9 pg (ref 26.0–34.0)
MCHC: 33.3 g/dL (ref 32.0–36.0)
RDW: 12.9 % (ref 11.5–14.5)
WBC: 8.6 10*3/uL (ref 3.6–11.0)

## 2013-07-29 ENCOUNTER — Encounter: Payer: Self-pay | Admitting: Family Medicine

## 2013-08-01 ENCOUNTER — Ambulatory Visit: Payer: Self-pay | Admitting: Gastroenterology

## 2013-08-06 LAB — PATHOLOGY REPORT

## 2013-08-19 ENCOUNTER — Ambulatory Visit: Payer: Self-pay | Admitting: Gastroenterology

## 2013-08-19 LAB — CBC
HCT: 41.3 % (ref 35.0–47.0)
HGB: 13.6 g/dL (ref 12.0–16.0)
MCH: 31 pg (ref 26.0–34.0)
MCHC: 33.1 g/dL (ref 32.0–36.0)
MCV: 94 fL (ref 80–100)
Platelet: 188 10*3/uL (ref 150–440)
RBC: 4.4 10*6/uL (ref 3.80–5.20)
RDW: 12.4 % (ref 11.5–14.5)
WBC: 5.9 10*3/uL (ref 3.6–11.0)

## 2013-08-19 LAB — COMPREHENSIVE METABOLIC PANEL
Albumin: 4.3 g/dL (ref 3.4–5.0)
Alkaline Phosphatase: 75 U/L
Anion Gap: 4 — ABNORMAL LOW (ref 7–16)
BILIRUBIN TOTAL: 0.4 mg/dL (ref 0.2–1.0)
BUN: 20 mg/dL — ABNORMAL HIGH (ref 7–18)
Calcium, Total: 9.6 mg/dL (ref 8.5–10.1)
Chloride: 99 mmol/L (ref 98–107)
Co2: 31 mmol/L (ref 21–32)
Creatinine: 0.97 mg/dL (ref 0.60–1.30)
EGFR (African American): >60
EGFR (Non-African Amer.): 54 — ABNORMAL LOW
Glucose: 84 mg/dL (ref 65–99)
Osmolality: 270 (ref 275–301)
Potassium: 3.4 mmol/L — ABNORMAL LOW (ref 3.5–5.1)
SGOT(AST): 31 U/L (ref 15–37)
SGPT (ALT): 22 U/L (ref 12–78)
SODIUM: 134 mmol/L — AB (ref 136–145)
Total Protein: 7.9 g/dL (ref 6.4–8.2)

## 2013-08-19 LAB — TROPONIN I

## 2013-08-22 LAB — PATHOLOGY REPORT

## 2013-09-19 ENCOUNTER — Other Ambulatory Visit: Payer: Self-pay | Admitting: Family Medicine

## 2013-09-19 NOTE — Telephone Encounter (Signed)
Px written for call in   

## 2013-09-19 NOTE — Telephone Encounter (Signed)
Electronic refill request, please advise  

## 2013-09-19 NOTE — Telephone Encounter (Signed)
Rx called in as prescribed 

## 2013-11-10 ENCOUNTER — Ambulatory Visit (INDEPENDENT_AMBULATORY_CARE_PROVIDER_SITE_OTHER): Payer: Medicare Other | Admitting: Family Medicine

## 2013-11-10 ENCOUNTER — Encounter: Payer: Self-pay | Admitting: Family Medicine

## 2013-11-10 VITALS — BP 142/86 | HR 48 | Temp 98.0°F | Ht 64.5 in | Wt 119.8 lb

## 2013-11-10 DIAGNOSIS — J019 Acute sinusitis, unspecified: Secondary | ICD-10-CM | POA: Insufficient documentation

## 2013-11-10 MED ORDER — AMOXICILLIN-POT CLAVULANATE 875-125 MG PO TABS: 1.0000 | ORAL_TABLET | Freq: Two times a day (BID) | ORAL | Status: DC

## 2013-11-10 NOTE — Patient Instructions (Signed)
Take the augmentin for a suspected sinus infection Drink lots of fluids  You can take a tramadol if you need it  Get flonase nasal spray over the counter - use it as directed for 1-2 weeks or through allergy season if needed  Update if not starting to improve in a week or if worsening  - we would consider a CT scan

## 2013-11-10 NOTE — Progress Notes (Signed)
Subjective:    Patient ID: Rachel Knox, female    DOB: Sep 05, 1927, 80 y.o.   MRN: [id number]  HPI Here with a headache  Across forehead  ? From tension or allergies   Both sides  Constant- not throbbing No dizziness or vision change   Wonders about sinus infection  Runny nose - cannot tell if color? Probably clear mucous  No fever  No coughing  No sneezing No eye symptoms   BP Readings from Last 3 Encounters:  11/10/13 142/86  06/27/13 142/62  12/25/12 142/70    No new meds or otc meds  occ aspirin - took one this am and it did not help   occ takes ultram for back pain - if she overdues it   Patient Active Problem List   Diagnosis Date Noted  . Acute sinusitis 11/10/2013  . Memory difficulties 06/27/2013  . Loss of weight 06/27/2013  . Stress reaction 06/27/2013  . Other screening mammogram 10/12/2010  . Osteoarthritis 10/12/2010  . Preventative health care 10/12/2010  . BACK PAIN 09/29/2009  . ABNORMAL FINDINGS GI TRACT 12/10/2008  . HYPERCHOLESTEROLEMIA, PURE 02/01/2007  . HYPERTENSION, ESSENTIAL NOS 02/01/2007  . CLOSTRIDIUM DIFFICILE COLITIS 01/22/2007  . ESOPHAGEAL STRICTURE 01/22/2007  . IBS 01/22/2007  . DEGENERATIVE DISC DISEASE 01/22/2007   Past Medical History  Diagnosis Date  . Diverticulosis of colon   . Hyperlipidemia   . Hypertension   . Osteoarthritis   . Urinary incontinence   . IBS (irritable bowel syndrome)    Past Surgical History  Procedure Laterality Date  . Appendectomy      with infection (surgery x2)  . Abdominal hysterectomy    . Tonsillectomy    . Bladder surgery      bladder tack  . Foot neuroma surgery    . Breast surgery      breast cyst needle biopsy  . Breast cyst excision  08/2002    breast discharge, cyst removed  . Shoulder surgery      spur   History  Substance Use Topics  . Smoking status: Former Games developermoker  . Smokeless tobacco: Never Used  . Alcohol Use: No   Family History  Problem Relation Age of  Onset  . Cancer Mother     kidney cancer  . Heart disease Father     MI  . Cancer Sister     lung  . Heart disease Sister     MI   Allergies  Allergen Reactions  . Amlodipine Besylate     REACTION: swelling  . Atorvastatin     REACTION: unspecified  . Azithromycin     REACTION: nausea and vomiting  . Clarithromycin     REACTION: severe diarrhea  . Ibuprofen     REACTION: unspecified  . Levofloxacin     REACTION: diarrhea  . Piroxicam     REACTION: unspecified  . Metoprolol     Asymptomatic but severe bradycardia   Current Outpatient Prescriptions on File Prior to Visit  Medication Sig Dispense Refill  . hydrochlorothiazide (HYDRODIURIL) 25 MG tablet Take 1 tablet (25 mg total) by mouth daily.  90 tablet  1  . omeprazole (PRILOSEC) 20 MG capsule Take 1 capsule (20 mg total) by mouth daily.  90 capsule  3  . simvastatin (ZOCOR) 20 MG tablet Take 1 tablet (20 mg total) by mouth daily.  90 tablet  1  . traMADol (ULTRAM) 50 MG tablet TAKE ONE TO TWO TABLETS BY MOUTH EVERY 8  HOURS AS NEEDED  30 tablet  3  . valsartan-hydrochlorothiazide (DIOVAN-HCT) 160-12.5 MG per tablet Take 1 tablet by mouth daily.  90 tablet  1   No current facility-administered medications on file prior to visit.    Review of Systems Review of Systems  Constitutional: Negative for fever, appetite change, and unexpected weight change.  ENT pos for congestion and rhinorrhea/ neg for ST, pos for frontal ha Eyes: Negative for pain and visual disturbance.  Respiratory: Negative for cough and shortness of breath.   Cardiovascular: Negative for cp or palpitations    Gastrointestinal: Negative for nausea, diarrhea and constipation.  Genitourinary: Negative for urgency and frequency.  Skin: Negative for pallor or rash   Neurological: Negative for weakness, light-headedness, numbness   Hematological: Negative for adenopathy. Does not bruise/bleed easily.  Psychiatric/Behavioral: Negative for dysphoric mood.  The patient is not nervous/anxious.         Objective:   Physical Exam  Constitutional: She appears well-developed and well-nourished. No distress.  HENT:  Head: Normocephalic and atraumatic.  Right Ear: External ear normal.  Left Ear: External ear normal.  Mouth/Throat: Oropharynx is clear and moist. No oropharyngeal exudate.  Nares are congested and boggy TMs slt dull with scant cerumen  Throat clear  Bilateral frontal sinus tenderness- marked   Eyes: Conjunctivae and EOM are normal. Pupils are equal, round, and reactive to light. Right eye exhibits no discharge. Left eye exhibits no discharge. No scleral icterus.  Neck: Normal range of motion. Neck supple. No JVD present. Carotid bruit is not present.  Cardiovascular: Normal rate and regular rhythm.  Exam reveals no gallop.   Pulmonary/Chest: Effort normal and breath sounds normal. No respiratory distress. She has no wheezes. She has no rales.  Lymphadenopathy:    She has no cervical adenopathy.  Neurological: She is alert. She has normal reflexes. She displays no tremor. No cranial nerve deficit or sensory deficit. Coordination and gait normal.  Skin: Skin is warm and dry. No rash noted. No erythema. No pallor.  Psychiatric: She has a normal mood and affect.          Assessment & Plan:

## 2013-11-10 NOTE — Assessment & Plan Note (Signed)
With tender frontal sinuses and some nasal complaints Will cover with augmentin and sympt care If worse or not imp will eval further  Update if not starting to improve in a week or if worsening

## 2013-11-10 NOTE — Progress Notes (Signed)
Pre visit review using our clinic review tool, if applicable. No additional management support is needed unless otherwise documented below in the visit note. 

## 2013-11-12 ENCOUNTER — Other Ambulatory Visit: Payer: Self-pay | Admitting: Family Medicine

## 2013-11-12 MED ORDER — VALSARTAN-HYDROCHLOROTHIAZIDE 160-12.5 MG PO TABS: 1.0000 | ORAL_TABLET | Freq: Every day | ORAL | Status: DC

## 2013-11-21 ENCOUNTER — Other Ambulatory Visit: Payer: Self-pay | Admitting: Family Medicine

## 2013-12-01 ENCOUNTER — Other Ambulatory Visit: Payer: Self-pay | Admitting: Family Medicine

## 2013-12-18 ENCOUNTER — Telehealth: Payer: Self-pay | Admitting: Family Medicine

## 2013-12-18 DIAGNOSIS — E7800 Pure hypercholesterolemia, unspecified: Secondary | ICD-10-CM

## 2013-12-18 DIAGNOSIS — I10 Essential (primary) hypertension: Secondary | ICD-10-CM

## 2013-12-18 NOTE — Telephone Encounter (Signed)
Message copied by Judy Pimple on Thu Dec 18, 2013  3:10 PM ------      Message from: Alvina Chou      Created: Tue Dec 09, 2013  2:57 PM      Regarding: lab orders for Friday, 5.29.15       Patient is scheduled for CPX labs, please order future labs, Thanks , Terri       ------

## 2013-12-19 ENCOUNTER — Other Ambulatory Visit: Payer: Medicare Other

## 2013-12-19 ENCOUNTER — Telehealth: Payer: Self-pay | Admitting: Family Medicine

## 2013-12-19 NOTE — Telephone Encounter (Signed)
Relevant patient education mailed to patient.  

## 2013-12-26 ENCOUNTER — Encounter: Payer: Medicare Other | Admitting: Family Medicine

## 2014-01-02 ENCOUNTER — Other Ambulatory Visit: Payer: Self-pay | Admitting: Family Medicine

## 2014-01-02 NOTE — Telephone Encounter (Signed)
Rx called in as prescribed 

## 2014-01-02 NOTE — Telephone Encounter (Signed)
Electronic refill request, please advise  

## 2014-01-02 NOTE — Telephone Encounter (Signed)
Px written for call in   

## 2014-02-02 ENCOUNTER — Ambulatory Visit: Payer: Self-pay | Admitting: Emergency Medicine

## 2014-02-02 LAB — COMPREHENSIVE METABOLIC PANEL
ALK PHOS: 80 U/L
ANION GAP: 8 (ref 7–16)
AST: 25 U/L (ref 15–37)
Albumin: 3.8 g/dL (ref 3.4–5.0)
BUN: 19 mg/dL — ABNORMAL HIGH (ref 7–18)
Bilirubin,Total: 0.6 mg/dL (ref 0.2–1.0)
CO2: 31 mmol/L (ref 21–32)
CREATININE: 1.17 mg/dL (ref 0.60–1.30)
Calcium, Total: 9.2 mg/dL (ref 8.5–10.1)
Chloride: 99 mmol/L (ref 98–107)
EGFR (African American): 50 — ABNORMAL LOW
EGFR (Non-African Amer.): 43 — ABNORMAL LOW
Glucose: 88 mg/dL (ref 65–99)
OSMOLALITY: 277 (ref 275–301)
Potassium: 3.5 mmol/L (ref 3.5–5.1)
SGPT (ALT): 20 U/L (ref 12–78)
SODIUM: 138 mmol/L (ref 136–145)
Total Protein: 7.8 g/dL (ref 6.4–8.2)

## 2014-02-02 LAB — CBC WITH DIFFERENTIAL/PLATELET
BASOS ABS: 0.1 10*3/uL (ref 0.0–0.1)
Basophil %: 0.8 %
EOS PCT: 1.1 %
Eosinophil #: 0.1 10*3/uL (ref 0.0–0.7)
HCT: 39.2 % (ref 35.0–47.0)
HGB: 12.9 g/dL (ref 12.0–16.0)
LYMPHS ABS: 2.8 10*3/uL (ref 1.0–3.6)
LYMPHS PCT: 26.2 %
MCH: 31 pg (ref 26.0–34.0)
MCHC: 32.8 g/dL (ref 32.0–36.0)
MCV: 95 fL (ref 80–100)
Monocyte #: 0.4 x10 3/mm (ref 0.2–0.9)
Monocyte %: 3.4 %
Neutrophil #: 7.2 10*3/uL — ABNORMAL HIGH (ref 1.4–6.5)
Neutrophil %: 68.5 %
PLATELETS: 237 10*3/uL (ref 150–440)
RBC: 4.15 10*6/uL (ref 3.80–5.20)
RDW: 12.4 % (ref 11.5–14.5)
WBC: 10.6 10*3/uL (ref 3.6–11.0)

## 2014-02-02 LAB — LIPASE, BLOOD: Lipase: 103 U/L (ref 73–393)

## 2014-02-25 ENCOUNTER — Other Ambulatory Visit: Payer: Self-pay | Admitting: Family Medicine

## 2014-03-02 ENCOUNTER — Other Ambulatory Visit: Payer: Self-pay | Admitting: *Deleted

## 2014-03-02 MED ORDER — HYDROCHLOROTHIAZIDE 25 MG PO TABS: ORAL_TABLET | ORAL | Status: DC

## 2014-03-11 ENCOUNTER — Other Ambulatory Visit: Payer: Self-pay | Admitting: Family Medicine

## 2014-03-13 ENCOUNTER — Other Ambulatory Visit: Payer: Self-pay | Admitting: Family Medicine

## 2014-03-14 ENCOUNTER — Other Ambulatory Visit: Payer: Self-pay | Admitting: Family Medicine

## 2014-03-16 NOTE — Telephone Encounter (Signed)
Px written for call in   

## 2014-03-16 NOTE — Telephone Encounter (Signed)
Rx called in as prescribed 

## 2014-03-16 NOTE — Telephone Encounter (Signed)
Electronic refill request, please advise  

## 2014-05-29 ENCOUNTER — Encounter: Payer: Self-pay | Admitting: Family Medicine

## 2014-05-29 ENCOUNTER — Other Ambulatory Visit: Payer: Medicare Other

## 2014-05-29 ENCOUNTER — Ambulatory Visit (INDEPENDENT_AMBULATORY_CARE_PROVIDER_SITE_OTHER): Payer: Medicare Other | Admitting: Family Medicine

## 2014-05-29 VITALS — BP 138/62 | HR 52 | Temp 97.3°F | Ht 64.5 in | Wt 121.2 lb

## 2014-05-29 DIAGNOSIS — N390 Urinary tract infection, site not specified: Secondary | ICD-10-CM | POA: Insufficient documentation

## 2014-05-29 DIAGNOSIS — N3000 Acute cystitis without hematuria: Secondary | ICD-10-CM

## 2014-05-29 DIAGNOSIS — R109 Unspecified abdominal pain: Secondary | ICD-10-CM

## 2014-05-29 LAB — POCT URINALYSIS DIPSTICK
BILIRUBIN UA: NEGATIVE
Glucose, UA: NEGATIVE
KETONES UA: NEGATIVE
Nitrite, UA: NEGATIVE
Spec Grav, UA: 1.02
Urobilinogen, UA: 0.2
pH, UA: 6

## 2014-05-29 MED ORDER — SULFAMETHOXAZOLE-TRIMETHOPRIM 800-160 MG PO TABS: 1.0000 | ORAL_TABLET | Freq: Two times a day (BID) | ORAL | Status: DC

## 2014-05-29 NOTE — Progress Notes (Signed)
Subjective:    Patient ID: Rachel Knox, female    DOB: 05/31/1928, 80 y.o.   MRN: [id number]  HPI Low back pain is worse than usual  ? How long  No bladder pain  Noticed urinary frequency and urgency  No blood in urine  No burning to urinate   No fever or n/v   Results for orders placed or performed in visit on 05/29/14  POCT urinalysis dipstick  Result Value Ref Range   Color, UA yellow    Clarity, UA cloudy    Glucose, UA neg.    Bilirubin, UA neg.    Ketones, UA neg.    Spec Grav, UA 1.020    Blood, UA Trace    pH, UA 6.0    Protein, UA 15+    Urobilinogen, UA 0.2    Nitrite, UA neg.    Leukocytes, UA moderate (2+)     Patient Active Problem List   Diagnosis Date Noted  . Acute sinusitis 11/10/2013  . Memory difficulties 06/27/2013  . Loss of weight 06/27/2013  . Stress reaction 06/27/2013  . Other screening mammogram 10/12/2010  . Osteoarthritis 10/12/2010  . Preventative health care 10/12/2010  . BACK PAIN 09/29/2009  . ABNORMAL FINDINGS GI TRACT 12/10/2008  . HYPERCHOLESTEROLEMIA, PURE 02/01/2007  . HYPERTENSION, ESSENTIAL NOS 02/01/2007  . CLOSTRIDIUM DIFFICILE COLITIS 01/22/2007  . ESOPHAGEAL STRICTURE 01/22/2007  . IBS 01/22/2007  . DEGENERATIVE DISC DISEASE 01/22/2007   Past Medical History  Diagnosis Date  . Diverticulosis of colon   . Hyperlipidemia   . Hypertension   . Osteoarthritis   . Urinary incontinence   . IBS (irritable bowel syndrome)    Past Surgical History  Procedure Laterality Date  . Appendectomy      with infection (surgery x2)  . Abdominal hysterectomy    . Tonsillectomy    . Bladder surgery      bladder tack  . Foot neuroma surgery    . Breast surgery      breast cyst needle biopsy  . Breast cyst excision  08/2002    breast discharge, cyst removed  . Shoulder surgery      spur   History  Substance Use Topics  . Smoking status: Former Games developermoker  . Smokeless tobacco: Never Used  . Alcohol Use: No   Family  History  Problem Relation Age of Onset  . Cancer Mother     kidney cancer  . Heart disease Father     MI  . Cancer Sister     lung  . Heart disease Sister     MI   Allergies  Allergen Reactions  . Amlodipine Besylate     REACTION: swelling  . Atorvastatin     REACTION: unspecified  . Azithromycin     REACTION: nausea and vomiting  . Clarithromycin     REACTION: severe diarrhea  . Ibuprofen     REACTION: unspecified  . Levofloxacin     REACTION: diarrhea  . Piroxicam     REACTION: unspecified  . Metoprolol     Asymptomatic but severe bradycardia   Current Outpatient Prescriptions on File Prior to Visit  Medication Sig Dispense Refill  . hydrochlorothiazide (HYDRODIURIL) 25 MG tablet TAKE 1 TABLET BY MOUTH ONCE A DAY 30 tablet 2  . omeprazole (PRILOSEC) 20 MG capsule Take 1 capsule (20 mg total) by mouth daily. 90 capsule 3  . simvastatin (ZOCOR) 20 MG tablet TAKE ONE TABLET BY MOUTH ONCE DAILY 90 tablet  0  . traMADol (ULTRAM) 50 MG tablet TAKE ONE TO TWO TABLETS BY MOUTH EVERY 8 HOURS AS NEEDED 60 tablet 3  . valsartan-hydrochlorothiazide (DIOVAN-HCT) 160-12.5 MG per tablet TAKE ONE TABLET BY MOUTH ONCE DAILY 90 tablet 1   No current facility-administered medications on file prior to visit.     Review of Systems    Review of Systems  Constitutional: Negative for fever, appetite change, fatigue and unexpected weight change.  Eyes: Negative for pain and visual disturbance.  Respiratory: Negative for cough and shortness of breath.   Cardiovascular: Negative for cp or palpitations    Gastrointestinal: Negative for nausea, diarrhea and constipation.  Genitourinary: pos  for urgency and frequency. neg for hematuria / pos for low back pain but not flank pain  Skin: Negative for pallor or rash   Neurological: Negative for weakness, light-headedness, numbness and headaches.  Hematological: Negative for adenopathy. Does not bruise/bleed easily.  Psychiatric/Behavioral:  Negative for dysphoric mood. The patient is not nervous/anxious.      Objective:   Physical Exam  Constitutional: She appears well-developed and well-nourished. No distress.  HENT:  Head: Normocephalic and atraumatic.  Mouth/Throat: Oropharynx is clear and moist.  Eyes: Conjunctivae and EOM are normal. Pupils are equal, round, and reactive to light. No scleral icterus.  Neck: Normal range of motion. Neck supple.  Cardiovascular: Normal rate, regular rhythm and normal heart sounds.   Pulmonary/Chest: Effort normal and breath sounds normal.  Abdominal: Soft. Bowel sounds are normal. She exhibits no distension and no mass. There is tenderness. There is no rebound and no guarding.  Mild suprapubic tenderness No cva tenderness   Neurological: She is alert.  Skin: Skin is warm and dry. No rash noted. No erythema. No pallor.  Psychiatric: She has a normal mood and affect.          Assessment & Plan:   Problem List Items Addressed This Visit      Genitourinary   UTI (urinary tract infection) - Primary    With low back pain and freq/urgency and pos ua  Pend cx Enc fluids Take septra bid for 7 d Watch for confusion or other symptoms  Update if not improving     Relevant Medications      sulfamethoxazole-trimethoprim (BACTRIM DS) tablet 800-160 mg   Other Relevant Orders      Urine culture (Completed)    Other Visit Diagnoses    Flank pain        Relevant Orders       POCT urinalysis dipstick (Completed)

## 2014-05-29 NOTE — Assessment & Plan Note (Signed)
With low back pain and freq/urgency and pos ua  Pend cx Enc fluids Take septra bid for 7 d Watch for confusion or other symptoms  Update if not improving

## 2014-05-29 NOTE — Progress Notes (Signed)
Pre visit review using our clinic review tool, if applicable. No additional management support is needed unless otherwise documented below in the visit note. 

## 2014-05-29 NOTE — Patient Instructions (Signed)
Drink lots of water  Take the septra as directed  We will contact you when urine culture is back  Update if not starting to improve in a week or if worsening

## 2014-05-31 LAB — URINE CULTURE

## 2014-06-05 ENCOUNTER — Encounter: Payer: Medicare Other | Admitting: Family Medicine

## 2014-06-10 ENCOUNTER — Ambulatory Visit (INDEPENDENT_AMBULATORY_CARE_PROVIDER_SITE_OTHER): Payer: Medicare Other | Admitting: Family Medicine

## 2014-06-10 ENCOUNTER — Encounter: Payer: Self-pay | Admitting: Family Medicine

## 2014-06-10 VITALS — BP 160/60 | HR 59 | Temp 97.6°F | Ht 64.5 in | Wt 123.5 lb

## 2014-06-10 DIAGNOSIS — J0121 Acute recurrent ethmoidal sinusitis: Secondary | ICD-10-CM

## 2014-06-10 DIAGNOSIS — M519 Unspecified thoracic, thoracolumbar and lumbosacral intervertebral disc disorder: Secondary | ICD-10-CM

## 2014-06-10 MED ORDER — AMOXICILLIN-POT CLAVULANATE 875-125 MG PO TABS: 1.0000 | ORAL_TABLET | Freq: Two times a day (BID) | ORAL | Status: DC

## 2014-06-10 NOTE — Progress Notes (Signed)
Pre visit review using our clinic review tool, if applicable. No additional management support is needed unless otherwise documented below in the visit note. 

## 2014-06-10 NOTE — Patient Instructions (Signed)
For sinus infection drink lots of fluids and take plain mucinex (or if coughing mucinex DM) Do NOT take mucinex D because it raises blood pressure Nasal saline spray is ok  Warm compresses on your face help/ breathing steam helps  For your back -take tramadol with caution as needed  Use heat on your back when it it stiff  Keep walking   If worse or not improving in the next week please let me know

## 2014-06-10 NOTE — Progress Notes (Signed)
Subjective:    Patient ID: Rachel Knox, female    DOB: 21-May-1928, 80 y.o.   MRN: [id number]  HPI Here for back pain and also sinus problems   She raked leaves the other day  Very congested and some cough  Post nasal drip also  Using mucinex D  Has pain in face - thinks she has a sinus infection  Does not think this is a cold-sinus pressure is much more severe  Mucous is while  Cough is rattling but not productive  No fever or chills or sweats   Also back pain is worse  Has hx of deg disc disease in LS  Took tramadol this am and it helped  Pain is in low back -right in the middle  No radiation to legs No n/t or weakness     bp is up today-probably from the decongestant  BP Readings from Last 3 Encounters:  06/10/14 160/60  05/29/14 138/62  11/10/13 142/86    Had a recent uti tx with sulfa Feels totally better from that   Patient Active Problem List   Diagnosis Date Noted  . UTI (urinary tract infection) 05/29/2014  . Acute sinusitis 11/10/2013  . Memory difficulties 06/27/2013  . Loss of weight 06/27/2013  . Stress reaction 06/27/2013  . Other screening mammogram 10/12/2010  . Osteoarthritis 10/12/2010  . Preventative health care 10/12/2010  . BACK PAIN 09/29/2009  . ABNORMAL FINDINGS GI TRACT 12/10/2008  . HYPERCHOLESTEROLEMIA, PURE 02/01/2007  . HYPERTENSION, ESSENTIAL NOS 02/01/2007  . CLOSTRIDIUM DIFFICILE COLITIS 01/22/2007  . ESOPHAGEAL STRICTURE 01/22/2007  . IBS 01/22/2007  . Lumbar disc disease 01/22/2007   Past Medical History  Diagnosis Date  . Diverticulosis of colon   . Hyperlipidemia   . Hypertension   . Osteoarthritis   . Urinary incontinence   . IBS (irritable bowel syndrome)    Past Surgical History  Procedure Laterality Date  . Appendectomy      with infection (surgery x2)  . Abdominal hysterectomy    . Tonsillectomy    . Bladder surgery      bladder tack  . Foot neuroma surgery    . Breast surgery      breast cyst  needle biopsy  . Breast cyst excision  08/2002    breast discharge, cyst removed  . Shoulder surgery      spur   History  Substance Use Topics  . Smoking status: Former Games developermoker  . Smokeless tobacco: Never Used  . Alcohol Use: No   Family History  Problem Relation Age of Onset  . Cancer Mother     kidney cancer  . Heart disease Father     MI  . Cancer Sister     lung  . Heart disease Sister     MI   Allergies  Allergen Reactions  . Amlodipine Besylate     REACTION: swelling  . Atorvastatin     REACTION: unspecified  . Azithromycin     REACTION: nausea and vomiting  . Clarithromycin     REACTION: severe diarrhea  . Ibuprofen     REACTION: unspecified  . Levofloxacin     REACTION: diarrhea  . Piroxicam     REACTION: unspecified  . Metoprolol     Asymptomatic but severe bradycardia   Current Outpatient Prescriptions on File Prior to Visit  Medication Sig Dispense Refill  . hydrochlorothiazide (HYDRODIURIL) 25 MG tablet TAKE 1 TABLET BY MOUTH ONCE A DAY 30 tablet 2  .  omeprazole (PRILOSEC) 20 MG capsule Take 1 capsule (20 mg total) by mouth daily. 90 capsule 3  . simvastatin (ZOCOR) 20 MG tablet TAKE ONE TABLET BY MOUTH ONCE DAILY 90 tablet 0  . traMADol (ULTRAM) 50 MG tablet TAKE ONE TO TWO TABLETS BY MOUTH EVERY 8 HOURS AS NEEDED 60 tablet 3  . valsartan-hydrochlorothiazide (DIOVAN-HCT) 160-12.5 MG per tablet TAKE ONE TABLET BY MOUTH ONCE DAILY 90 tablet 1   No current facility-administered medications on file prior to visit.    Review of Systems Review of Systems  Constitutional: Negative for fever, appetite change,  and unexpected weight change.  ENT pos for cong/rhinorrhea/ purulent d/c and facial pain  Eyes: Negative for pain and visual disturbance.  Respiratory: Negative for wheeze  and shortness of breath.   Cardiovascular: Negative for cp or palpitations    Gastrointestinal: Negative for nausea, diarrhea and constipation.  Genitourinary: Negative for  urgency and frequency.  Skin: Negative for pallor or rash   MSK pos for back pain (low) w/o radiation  Neurological: Negative for weakness, light-headedness, numbness and headaches.  Hematological: Negative for adenopathy. Does not bruise/bleed easily.  Psychiatric/Behavioral: Negative for dysphoric mood. The patient is not nervous/anxious.         Objective:   Physical Exam  Constitutional: She appears well-developed and well-nourished. No distress.  Well appearing elderly female  HENT:  Head: Normocephalic and atraumatic.  Right Ear: External ear normal.  Left Ear: External ear normal.  Mouth/Throat: Oropharynx is clear and moist. No oropharyngeal exudate.  Nares are injected and congested  bilat maxillary and ethmoid sinus tenderness   Eyes: Conjunctivae and EOM are normal. Pupils are equal, round, and reactive to light. Right eye exhibits no discharge. Left eye exhibits no discharge.  Neck: Normal range of motion. Neck supple.  Cardiovascular: Normal rate, regular rhythm and normal heart sounds.   Pulmonary/Chest: Effort normal and breath sounds normal. No respiratory distress. She has no wheezes. She has no rales.  Musculoskeletal: She exhibits tenderness. She exhibits no edema.  Tenderness in lumbar musculature bilaterally  Spasm noted No bony tenderness Neg SLR Nl hip rom   Lymphadenopathy:    She has no cervical adenopathy.  Neurological: She is alert. She has normal strength and normal reflexes. She displays no atrophy. No sensory deficit. She exhibits normal muscle tone.  Skin: Skin is warm and dry. No rash noted. No erythema. No pallor.  Psychiatric: She has a normal mood and affect.          Assessment & Plan:   Problem List Items Addressed This Visit      Respiratory   Acute sinusitis - Primary    Pt is prone to bacterial sinusitis  Cover with augmentin  Disc symptomatic care - see instructions on AVS  Will avoid pseudoephedrine due to effect on bp    Update if not starting to improve in a week or if worsening      Relevant Medications      amoxicillin-clavulanate (AUGMENTIN) tablet 875-125 mg     Musculoskeletal and Integument   Lumbar disc disease    Pain exacerbation yesterday after raking leaves Improved today  Has tramadol to use prn with caution  Reassuring exam-no neuro changes Update if not starting to improve in a week or if worsening

## 2014-06-11 NOTE — Assessment & Plan Note (Signed)
Pt is prone to bacterial sinusitis  Cover with augmentin  Disc symptomatic care - see instructions on AVS  Will avoid pseudoephedrine due to effect on bp   Update if not starting to improve in a week or if worsening

## 2014-06-11 NOTE — Assessment & Plan Note (Signed)
Pain exacerbation yesterday after raking leaves Improved today  Has tramadol to use prn with caution  Reassuring exam-no neuro changes Update if not starting to improve in a week or if worsening

## 2014-06-14 ENCOUNTER — Other Ambulatory Visit: Payer: Self-pay | Admitting: Family Medicine

## 2014-06-19 ENCOUNTER — Other Ambulatory Visit: Payer: Self-pay | Admitting: Family Medicine

## 2014-07-16 ENCOUNTER — Other Ambulatory Visit: Payer: Self-pay | Admitting: Family Medicine

## 2014-09-09 ENCOUNTER — Other Ambulatory Visit: Payer: Self-pay | Admitting: Family Medicine

## 2014-09-25 DIAGNOSIS — H3531 Nonexudative age-related macular degeneration: Secondary | ICD-10-CM | POA: Diagnosis not present

## 2014-10-05 ENCOUNTER — Ambulatory Visit (INDEPENDENT_AMBULATORY_CARE_PROVIDER_SITE_OTHER): Payer: Medicare Other | Admitting: Family Medicine

## 2014-10-05 ENCOUNTER — Encounter: Payer: Self-pay | Admitting: Family Medicine

## 2014-10-05 ENCOUNTER — Ambulatory Visit (INDEPENDENT_AMBULATORY_CARE_PROVIDER_SITE_OTHER)
Admission: RE | Admit: 2014-10-05 | Discharge: 2014-10-05 | Disposition: A | Discharge status: Home / Self Care | Payer: Medicare Other | Source: Ambulatory Visit | Attending: Family Medicine | Admitting: Family Medicine

## 2014-10-05 VITALS — BP 150/65 | HR 68 | Temp 98.0°F | Ht 64.5 in | Wt 124.8 lb

## 2014-10-05 DIAGNOSIS — M545 Low back pain, unspecified: Secondary | ICD-10-CM

## 2014-10-05 DIAGNOSIS — M5136 Other intervertebral disc degeneration, lumbar region: Secondary | ICD-10-CM | POA: Diagnosis not present

## 2014-10-05 DIAGNOSIS — M549 Dorsalgia, unspecified: Secondary | ICD-10-CM | POA: Diagnosis not present

## 2014-10-05 LAB — POCT URINALYSIS DIPSTICK
BILIRUBIN UA: NEGATIVE
Blood, UA: NEGATIVE
GLUCOSE UA: NEGATIVE
Ketones, UA: NEGATIVE
LEUKOCYTES UA: NEGATIVE
Nitrite, UA: NEGATIVE
PROTEIN UA: NEGATIVE
Spec Grav, UA: 1.015
Urobilinogen, UA: 0.2
pH, UA: 5.5

## 2014-10-05 NOTE — Progress Notes (Signed)
Subjective:    Patient ID: Rachel Knox, female    DOB: 03/10/1928, 81 y.o.   MRN: [id number]  HPI Here with back pain   Started over a month ago  No particular injury or activity that time  She takes tramadol - helps a little  Low back/ both sides  Poss spasm  Radiates up to upper back and neck - gives her headache   Last xr -2012 - disc dz L4-5 and also scoliosis   Her posture is not as good as it used to be   No urinary symptoms  No blood in urine    Results for orders placed or performed in visit on 10/05/14  POCT urinalysis dipstick  Result Value Ref Range   Color, UA Yellow    Clarity, UA clear    Glucose, UA negative    Bilirubin, UA negative    Ketones, UA negative    Spec Grav, UA 1.015    Blood, UA negative    pH, UA 5.5    Protein, UA negative    Urobilinogen, UA 0.2    Nitrite, UA negative    Leukocytes, UA Negative     Patient Active Problem List   Diagnosis Date Noted  . UTI (urinary tract infection) 05/29/2014  . Acute sinusitis 11/10/2013  . Memory difficulties 06/27/2013  . Loss of weight 06/27/2013  . Stress reaction 06/27/2013  . Other screening mammogram 10/12/2010  . Osteoarthritis 10/12/2010  . Preventative health care 10/12/2010  . Low back pain 09/29/2009  . ABNORMAL FINDINGS GI TRACT 12/10/2008  . HYPERCHOLESTEROLEMIA, PURE 02/01/2007  . HYPERTENSION, ESSENTIAL NOS 02/01/2007  . CLOSTRIDIUM DIFFICILE COLITIS 01/22/2007  . ESOPHAGEAL STRICTURE 01/22/2007  . IBS 01/22/2007  . Lumbar disc disease 01/22/2007   Past Medical History  Diagnosis Date  . Diverticulosis of colon   . Hyperlipidemia   . Hypertension   . Osteoarthritis   . Urinary incontinence   . IBS (irritable bowel syndrome)    Past Surgical History  Procedure Laterality Date  . Appendectomy      with infection (surgery x2)  . Abdominal hysterectomy    . Tonsillectomy    . Bladder surgery      bladder tack  . Foot neuroma surgery    . Breast surgery      breast cyst needle biopsy  . Breast cyst excision  08/2002    breast discharge, cyst removed  . Shoulder surgery      spur   History  Substance Use Topics  . Smoking status: Former Games developermoker  . Smokeless tobacco: Never Used  . Alcohol Use: No   Family History  Problem Relation Age of Onset  . Cancer Mother     kidney cancer  . Heart disease Father     MI  . Cancer Sister     lung  . Heart disease Sister     MI   Allergies  Allergen Reactions  . Amlodipine Besylate     REACTION: swelling  . Atorvastatin     REACTION: unspecified  . Azithromycin     REACTION: nausea and vomiting  . Clarithromycin     REACTION: severe diarrhea  . Ibuprofen     REACTION: unspecified  . Levofloxacin     REACTION: diarrhea  . Piroxicam     REACTION: unspecified  . Metoprolol     Asymptomatic but severe bradycardia   Current Outpatient Prescriptions on File Prior to Visit  Medication Sig Dispense Refill  .  hydrochlorothiazide (HYDRODIURIL) 25 MG tablet Take 1 tablet (25 mg total) by mouth daily. 30 tablet 5  . omeprazole (PRILOSEC) 20 MG capsule Take 1 capsule (20 mg total) by mouth daily. 90 capsule 3  . simvastatin (ZOCOR) 20 MG tablet TAKE ONE TABLET BY MOUTH ONCE DAILY 90 tablet 1  . traMADol (ULTRAM) 50 MG tablet TAKE ONE TO TWO TABLETS BY MOUTH EVERY 8 HOURS AS NEEDED 60 tablet 3  . valsartan-hydrochlorothiazide (DIOVAN-HCT) 160-12.5 MG per tablet Take 1 tablet by mouth daily. NEED TO MAKE ANNUAL PHYSICAL APPOINTMENT 90 tablet 0   No current facility-administered medications on file prior to visit.     Review of Systems Review of Systems  Constitutional: Negative for fever, appetite change, fatigue and unexpected weight change.  Eyes: Negative for pain and visual disturbance.  Respiratory: Negative for cough and shortness of breath.   Cardiovascular: Negative for cp or palpitations    Gastrointestinal: Negative for nausea, diarrhea and constipation.  Genitourinary: Negative  for urgency and frequency.  Skin: Negative for pallor or rash   MSK pos for low back pain that radiates up without rad to legs  Neurological: Negative for weakness, light-headedness, numbness and headaches.  Hematological: Negative for adenopathy. Does not bruise/bleed easily.  Psychiatric/Behavioral: Negative for dysphoric mood. The patient is not nervous/anxious.         Objective:   Physical Exam  Constitutional: She appears well-developed and well-nourished. No distress.  HENT:  Head: Normocephalic and atraumatic.  Mouth/Throat: Oropharynx is clear and moist.  Eyes: Conjunctivae and EOM are normal. Pupils are equal, round, and reactive to light. No scleral icterus.  Neck: Normal range of motion. Neck supple. Carotid bruit is not present.  Cardiovascular: Normal rate and regular rhythm.   Pulmonary/Chest: Effort normal and breath sounds normal. No respiratory distress. She has no wheezes. She has no rales.  Musculoskeletal: She exhibits tenderness. She exhibits no edema.       Lumbar back: She exhibits decreased range of motion, tenderness and bony tenderness. She exhibits no edema and no deformity.  Tender over lower LS and also bilateral lumbar musculature No piriformis or trochanteric tenderness Nl rom hips Neg LSR   LS flex 30 deg and ext 10 deg Pain on R flex as well  No neuro changes   Lymphadenopathy:    She has no cervical adenopathy.  Neurological: She has normal strength and normal reflexes. She displays no atrophy. No sensory deficit. She exhibits normal muscle tone.  Skin: Skin is warm and dry. No rash noted. No erythema.  Psychiatric: She has a normal mood and affect.          Assessment & Plan:   Problem List Items Addressed This Visit      Other   Low back pain - Primary    With hx of LS deg disc dz/ oa and also scoliosis (thoracolumbar)  Takes tramadol prn  xr today to look for changes I think she would benefit most from PT - but do not think she  would do that/be interested in it  Rev safe use of warm and cool compresses       Relevant Orders   DG Lumbar Spine Complete (Completed)    Other Visit Diagnoses    Bilateral back pain, unspecified location        Relevant Orders    POCT urinalysis dipstick (Completed)

## 2014-10-05 NOTE — Patient Instructions (Signed)
Xray of low back today  Continue tramadol if you need it (with caution)  Use heat on your back - warm compress  Walking is good   We will contact you with a result and plan

## 2014-10-05 NOTE — Assessment & Plan Note (Addendum)
With hx of LS deg disc dz/ oa and also scoliosis (thoracolumbar)  Takes tramadol prn  xr today to look for changes I think she would benefit most from PT - but do not think she would do that/be interested in it  Rev safe use of warm and cool compresses

## 2014-10-05 NOTE — Progress Notes (Signed)
Pre visit review using our clinic review tool, if applicable. No additional management support is needed unless otherwise documented below in the visit note. 

## 2014-10-06 ENCOUNTER — Telehealth: Payer: Self-pay

## 2014-10-06 NOTE — Telephone Encounter (Signed)
Left message for patient to call office regarding her xray results. 

## 2014-10-06 NOTE — Telephone Encounter (Signed)
-----   Message from Judy PimpleMarne A Tower, MD sent at 10/05/2014  9:25 PM EDT ----- Mild degenerative change/ signs of arthritis and scoliosis as expected  We discussed physical therapy as an option for treatment today If she is open to that-I will refer her  Continue tramadol as needed for pain

## 2014-10-08 ENCOUNTER — Other Ambulatory Visit: Payer: Self-pay | Admitting: Family Medicine

## 2014-10-08 NOTE — Telephone Encounter (Signed)
Px written for call in   

## 2014-10-08 NOTE — Telephone Encounter (Signed)
Tramadol refill request.  Patient last seen 10/05/2014.  Last filled 03/16/2014.  Please advise.

## 2014-10-08 NOTE — Telephone Encounter (Signed)
Medication phoned to pharmacy.  

## 2014-11-13 NOTE — Consult Note (Signed)
PATIENT NAME:  Rachel Knox, Rachel Knox MR#:  [id number] DATE OF BIRTH:  01-04-1928  DATE OF CONSULTATION:  11/16/2012  CONSULTING PHYSICIAN:  Christena DeemMartin U. Keilan Nichol, MD  HISTORY OF PRESENT ILLNESS: The patient is an 81 year old Caucasian female who came to the Emergency Room early this morning with a complaint of having food stuck in her esophagus and being unable to swallow. She was eating a chicken sandwich yesterday at 1:00 p.Knox., at which point she felt a piece of this was stuck in the esophagus. Since that time, she has not been able get secretions down or even water. She has had this difficulty in the past. She had an EGD for foreign body removal in 2009 as well as in 2004. She has a history of a Schatzki ring. She does take omeprazole 20 mg a day. She states that she has had some recent increase of symptoms of dysphagia over the period of the past 6 months or so. There has been some weight loss, but she states her appetite is good. She denies any nausea or vomiting or abdominal pain. She denies any heartburn. She has a bowel movement daily. No black stools, blood in the stools or slimy stools. She has had a colonoscopy in the past. It was negative for colon polyps, positive for diverticula. Her last 2 EGDs were done on 08/28/2002 for removal of a food impaction and then on 09/19/2002 for dilatation of a Schatzki ring.   PAST MEDICAL HISTORY: She has a history of hypercholesterolemia, hypertension, remote appendectomy, single salpingectomy. She had her tonsils removed years ago. She has a history of a finding of an intraductal papilloma removed via surgery. There was a right rotator cuff tear.  DICTATION ENDS HERE.  Please see addendum for continuation of dictation   ____________________________ Christena DeemMartin U. Tamaya Pun, MD mus:OSi D: 11/16/2012 08:18:00 ET T: 11/16/2012 08:45:21 ET JOB#: [id number]  cc: Christena DeemMartin U. Cleophas Yoak, MD, <Dictator> Christena DeemMARTIN U Nathon Stefanski MD ELECTRONICALLY SIGNED 12/03/2012 13:02

## 2014-11-13 NOTE — Consult Note (Signed)
PATIENT NAME:  Rachel Knox, Rachel Knox MR#:  [id number] DATE OF BIRTH:  1928/07/02  DATE OF CONSULTATION:  01/30/2013  REFERRING PHYSICIAN:  Maurilio Lovely, MD CONSULTING PHYSICIAN:  Lynnae Prude, MD / Ranae Plumber. Arvilla Market, ANP (Adult Nurse Practitioner)  REASON FOR CONSULTATION: Dysphagia.   HISTORY OF PRESENT ILLNESS: This 81 year old patient with known history of Schatzki's ring was eating a chicken dinner at 5:00 p.m. last night. She had chicken, green beans and creamed potatoes and believes that a chicken piece got caught in her esophagus. She thought that if she laid supine in bed at night the food bolus would pass.  The patient presents this morning to the Emergency Room with the food bolus still in the esophagus and she is experiencing chest pain and inability to swallow saliva.   This patient had upper endoscopy performed by Dr. Marva Panda 11/16/2012 for foreign body in the esophagus with findings of LA grade D reflux esophagitis, low-grade narrowing of Schatzki ring, gastritis, hiatus hernia and esophageal motility disorder. The foreign body passed spontaneously.  She had another impaction and underwent a repeat upper endoscopy 3 weeks later, on 12/09/2012, and it revealed esophagitis without bleeding and a large meat impaction found at the distal third of the esophagus. Multiple passes of a net, rat-tooth forceps and biopsy forceps were needed to clear the obstruction. Two rings were noted, 1 at the GEJ and the second about 1 cm above the GEJ. These were noted to be inflamed with multiple ecchymoses as well as in the distal esophagus. The patient advised to take Protonix b.i.d. and repeat upper endoscopy in 10 days for retreatment. The patient had taken meloxicam prior to this EGD study. She was due to have repeat endoscopy later this month to stretch the Schatzki ring.  PAST MEDICAL HISTORY:  1.  Recurrent esophageal stricture with Schatzki ring requiring dilatation. 2.  Esophageal dysmotility. 3.   Hiatus hernia. 4.  Hypertension. 5.  Hyperlipidemia. 6.  Diverticulosis.  7.  Irritable bowel syndrome.   PAST SURGICAL HISTORY: 1.  Hysterectomy, partial, 1965.  2.  Appendectomy.  3.  Hemorrhoid surgery. 4.  Tonsillectomy.  5.  Bladder tack.  6.  Breast cyst needle biopsy. 7.  Left foot surgery for Morton's neuroma.  8.  Shoulder surgery for spur.   MEDICATIONS: 1.  Hydrochlorothiazide 25 mg.  2.  Metoprolol 50 mg. 3.  Simvastatin 20 mg daily.  4.  Omeprazole 20 mg twice daily.  5.  Diovan/HCT 160/12.5 mg daily.  6.  Tramadol p.r.n.  7.  Mobic 7.5 mg once daily with meals.  8.  Allopurinol 300 mg once daily for gout.  9.  Fexofenadine 180 mg once daily.   ALLERGIES: PARAFON FORTE, UNSPECIFIED.   HABITS: Negative tobacco or alcohol.   REVIEW OF SYSTEMS: Positive for dysphagia, as noted in the history of present illness, associated with chest pain, no abdominal pain, unusual weight loss.  Remaining 10 systems negative.   PHYSICAL EXAMINATION:  VITAL SIGNS:  98.0, 60, 20, 190/77.  GENERAL: Elderly Caucasian female, thin, resting in ER bed, NAD.  HEENT: Head is normocephalic. Conjunctivae is injected. Sclerae are anicteric. Oral mucosa is moist and intact.  NECK: Supple. Trachea is midline. She is spitting saliva in a basin, emesis bag.  HEART: Heart tones regular, S1 and S2.  LUNGS: CTA. Respirations are nonlabored.  ABDOMEN: Soft, flat, nontender. No hepatosplenomegaly.  RECTAL: Deferred.  EXTREMITIES: Without edema, cyanosis or clubbing.  SKIN: Warm and dry without rash.  PSYCH: Affect and  mood within normal. She is interacting appropriately. Does not appear to be too anxious. NEUROLOGIC: Cranial nerves II through XII grossly intact. Follows all commands. Equal strength bilateral.   LABORATORY DATA:  Glucose 94, BUN 19, creatinine 1.09. Electrolytes unremarkable. WBC 6.2, hemoglobin 13.4.   IMPRESSION: The patient is with known history of recurrence of esophageal  obstruction with Schatzki's ring, esophageal dysmotility and history of severe esophagitis. The patient presents on Mobic. She presents on omeprazole twice daily. She had food impaction in April that resolved spontaneously and again in May which required removal. She was eating chicken last night and had another impaction that has been present since about 5:00 p.m.   PLAN: 1.  Recommend urgent EGD with bolus removal this morning. Dr. Mechele CollinElliott on call and will perform the procedure. The patient is n.p.o.  2.  Hypertension, elevated, likely with stress of impaction. The patient is without cardiopulmonary complaints.   PLAN: Recommend EGD with dilatation later this morning. Continue with chronic PPI therapy b.i.d. Avoid NSAIDs.    This case was discussed with Dr. Mechele CollinElliott in collaboration of care.   This services provided by Cala BradfordKimberly A. Arvilla MarketMills, MS, APRN, BC, ANP under collaborative agreement with Dr. Lynnae Prudeobert Elliott.  ____________________________ Ranae PlumberKimberly A. Arvilla MarketMills, ANP (Adult Nurse Practitioner) Rachel Knox D: 01/30/2013 09:36:50 ET T: 01/30/2013 10:09:30 ET JOB#: [id number]  cc: Cala BradfordKimberly A. Arvilla MarketMills, ANP (Adult Nurse Practitioner), <Dictator> Audrie GallusMarne A. Milinda Antisower, MD Ranae PlumberKimberly A. Suzette BattiestMills RN, MSN, ANP-BC Adult Nurse Practitioner ELECTRONICALLY SIGNED 01/30/2013 10:34

## 2014-11-13 NOTE — Consult Note (Signed)
PATIENT NAME:  Rachel Knox, Rachel Knox MR#:  [id number] DATE OF BIRTH:  04/18/1928  DATE OF CONSULTATION:  07/01/2013  CONSULTING PHYSICIAN:  Scot Junobert T. Elliott, MD  The patient is an 81 year old white female who has had foreign body obstruction of the esophagus with food 5 times. She was eating some steak last night and food lodged in her esophagus. She tried to get this up, would not go. She came into the ER this morning. They tried medications without effect. They contacted me, I recommended carbonated beverages; this was tried without good effect and so she is planned for upper endoscopy to remove this food bolus again.  The patient denies taking any thickening medications.  Denies any major problems after previous endoscopy and foreign body removal.   PAST MEDICAL HISTORY: Includes hypertension, appendectomy, tonsillectomy, tube removal, hypertension and elevated cholesterol.   ALLERGIES: PARAFON FORTE.   MEDICATIONS: Omeprazole, valsartan and, HCTZ 160/12.5, HCTZ 25 mg, simvastatin, tramadol and Baclofen.  HABITS: Does not smoke, does not drink.   PHYSICAL EXAMINATION: GENERAL:  Shows an elderly white female in no acute distress.  VITAL SIGNS: Temperature 97.8, pulse 64, blood pressure 160/74. Assessment: White female in no acute distress.  HEENT: Sclerae nonicteric. Conjunctivae negative. Tongue negative.  CHEST: Clear, slightly decreased breath sounds left base.  HEART: Shows no murmurs or gallops I can hear.  ABDOMEN: Negative oropharynx negative.   LABORATORY DATA: Glucose 90, BUN 20, creatinine 1.13, sodium 136, potassium 3.7, chloride 101, CO2 of 35. Liver panel is normal. White count 8.6, hemoglobin 12.9, platelet count 228.   ASSESSMENT: Foreign body obstruction female 81 years old. She has had 5 previous foreign body obstructions, previously scoped by Dr. Servando SnareWohl and Dr. Marva PandaSkulskie and Dr. Mechele CollinElliott.   PLAN: Upper endoscopy with intubation, have patient moved to the endo  unit.   ____________________________ Scot Junobert T. Elliott, MD rte:ce D: 07/01/2013 12:32:35 ET T: 07/01/2013 13:31:25 ET JOB#: [id number]  cc: Scot Junobert T. Elliott, MD, <Dictator> Duane LopeJeffrey D. Judithann SheenSparks, MD Midge Miniumarren Wohl, MD Christena DeemMartin U. Skulskie, MD  Scot JunOBERT T ELLIOTT MD ELECTRONICALLY SIGNED 07/05/2013 10:26

## 2014-11-13 NOTE — Consult Note (Signed)
PATIENT NAME:  Rachel Knox, Rachel Knox MR#:  [id number] DATE OF BIRTH:  1928/02/23  DATE OF CONSULTATION:  11/16/2012  REFERRING PHYSICIAN: Enedina Finnerandolph N. Manson PasseyBrown, MD CONSULTING PHYSICIAN:  Christena DeemMartin U. Maja Mccaffery, MD  Addendum. This is a continuation.  PAST MEDICAL HISTORY: The patient does have a history of surgery for a right rotator cuff tear.   CURRENT MEDICATIONS: Include Diovan HCT dose uncertain, hydrochlorothiazide 25 mg a day, metoprolol 50 mg daily, simvastatin 20 mg a day. It is of note that although the patient states that she does take omeprazole daily, it is not listed with her medications.   ALLERGIES: SHE IS ALLERGIC TO PARAFON FORTE.   PHYSICAL EXAMINATION:  VITAL SIGNS: Temperature is 98.3, pulse 62, respirations 18, blood pressure 177/89, pulse oximetry 96%.  GENERAL: She is an 81 year old Caucasian female in no acute distress.  HEENT: Normocephalic, atraumatic. Eyes are anicteric. Nose: Septum midline. No lesions. Oropharynx: The patient has no molars, either upper or lower. She does have front teeth that occlude and prevent the closure of the gums in the back.  NECK: Supple. No JVD. No lymphadenopathy. No thyromegaly.  HEART: Regular rate and rhythm.  LUNGS: Bilaterally clear.  ABDOMEN: Soft, nontender, nondistended. Bowel sounds positive, normoactive.  ANORECTAL: Exam deferred.  EXTREMITIES: No clubbing, cyanosis or edema.  NEUROLOGICAL: Cranial nerves II through XII grossly intact. Muscle strength bilaterally equal and symmetric, 5 out of 5. DTRs bilaterally equal and symmetric.   LABORATORY DATA: Today, include a glucose of 102, BUN 20, creatinine 0.86, sodium 141, potassium 3.5, chloride 104, bicarbonate 31, osmolality 284, calcium 9.7. Hemogram showing a white count of 7.6, H and H 13.7/40.6, platelet count of 178, MCV is 92. Her pro time is 12.9, INR of 1.0. There has been no imaging.   ASSESSMENT: Esophageal food impaction of chicken as noted above. The patient with similar  presentations twice in the past. The patient is essentially edentulous in terms of mastication surfaces, and indeed, the occlusion of her front teeth would prevent mastication of foods with the posterior alveolar ridges. I discussed this today with her and her daughter, who was present in the room. The patient states she does take a proton pump inhibitor, though as noted, not listed.   RECOMMENDATION:  1. Will need to proceed with EGD and foreign body removal with anesthesia assistance. I have discussed the risks, benefits and complications of this procedure to include, but not limited to bleeding, infection, perforation and the risk of sedation, and she wishes to proceed.  2. The patient will need to maintain being on a proton pump inhibitor regularly. I have also cautioned the patient in terms of behavior of cutting foods up appropriately to assist with passing foods that cannot be appropriately chewed otherwise.    ____________________________ Christena DeemMartin U. Genavie Boettger, MD mus:OSi D: 11/16/2012 08:23:28 ET T: 11/16/2012 08:53:42 ET JOB#: [id number]  cc: Christena DeemMartin U. Jamont Mellin, MD, <Dictator> Christena DeemMARTIN U Kimani Bedoya MD ELECTRONICALLY SIGNED 12/03/2012 13:02

## 2014-11-14 NOTE — Consult Note (Signed)
PATIENT NAME:  Rachel Knox, Dorraine M MR#:  [id number] DATE OF BIRTH:  08-30-1927  DATE OF CONSULTATION:  08/19/2013  REFERRING PHYSICIAN:  [facility] CONSULTING PHYSICIAN:  Dow AdolphMatthew Rein, MD  REASON FOR THE CONSULT: Food impaction.   HISTORY OF PRESENT ILLNESS: Ms. Rachel Knox is an 81 year old female with a history of recurrent food impactions, a recent manometry study showing jackhammer esophagus, presenting to the Emergency Room for evaluation of a food bolus. Ms. Rachel Knox reports that she was eating beans and some beef stew last night and food got stuck. Ever since then, she has had a sensation of something being stuck in her chest. In addition, she has not been able to swallow any liquids. This is similar to her previous episodes of food bolus.   Of note, Ms. Rachel Knox did have an upper endoscopy back on 08/01/2013. This did show some mild inflammation in the gastric body. Dilation was done with a through-the-scope dilator to a maximum of 16.5 mm. She has had multiple dilations in the past.  The plan on this was to refer her to [facility] for [facility].   PAST MEDICAL HISTORY: 1.  Jackhammer esophagus.  2.  Esophageal obstruction with Schatzki ring.  3.  Gastritis.  4.  Hiatal hernia.  5.  Hypertension.  6.  Hyperlipidemia.  7.  Diverticulosis.  8.  Irritable bowel syndrome.   MEDICATIONS: 1.  Hydrochlorothiazide 25 mg daily.  2.  Diovan HCT 160/12.5 mg daily.  3.  Simvastatin 20 mg daily.  4.  Tramadol 50 mg q.8 hours.  5.  Omeprazole 20 mg twice a day.  6.  Baclofen 5 mg twice a day as needed.     PHYSICAL EXAMINATION: VITAL SIGNS: Stable.    GENERAL: Alert and oriented times 4.  No acute distress. Appears stated age. HEENT: Normocephalic/atraumatic. Extraocular movements are intact. Anicteric. NECK: Soft, supple. JVP appears normal. No adenopathy. CHEST: Clear to auscultation. No wheeze or crackle. Respirations unlabored. HEART: Regular. No murmur, rub,  or gallop.  Normal S1 and S2. ABDOMEN: Soft, nontender, nondistended.  Normal active bowel sounds in all four quadrants.  No organomegaly. No masses EXTREMITIES: No swelling, well perfused. SKIN: No rash or lesion. Skin color, texture, turgor normal. NEUROLOGICAL: Grossly intact. PSYCHIATRIC: Normal tone and affect. MUSCULOSKELETAL: No joint swelling or erythema.   REVIEW OF SYSTEMS:   CONSTITUTIONAL: No weight gain or weight loss.  No fever or chills. HEENT: No oral lesions or sore throat. No vision changes. GASTROINTESTINAL: See HPI.  HEME/LYMPH: No easy bruising or bleeding. CARDIOVASCULAR: No chest pain or dyspnea on exertion. GENITOURINARY: No hematuria. INTEGUMENTARY: No rashes or pruritus PSYCHIATRIC: No depression/anxiety.  ENDOCRINE: No heat/cold intolerance, no hair loss or skin changes. ALLERGIC/IMMUNOLOGIC: Negative for hives. RESPIRATORY: No cough, no shortness of breath.  MUSCULOSKELETAL: No joint swelling or muscle pain.   SOCIAL HISTORY: She denies tobacco or alcohol.   FAMILY HISTORY: No family history of colon malignancy or esophageal malignancy that she is aware of.   ASSESSMENT AND PLAN:   1.  Esophageal food bolus. I will plan to perform an upper endoscopy for food bolus removal. This will be done with anesthesia with intubation to prevent aspiration of the food contents.   I would recommend for the jackhammer esophagus a trial of long-acting nitrates instead of baclofen. She could also try a calcium channel blocker, but given her current resting heart rate in the low 60s, this likely will not be an option.   Thank  you for this consult.   ____________________________ Dow Adolph, MD mr:dmm D: 08/19/2013 22:37:22 ET T: 08/19/2013 22:58:28 ET JOB#: [id number]  cc: Dow Adolph, MD, <Dictator> Kathalene Frames MD ELECTRONICALLY SIGNED 09/16/2013 13:46

## 2015-01-29 ENCOUNTER — Telehealth: Payer: Self-pay | Admitting: Family Medicine

## 2015-01-29 ENCOUNTER — Encounter: Payer: Self-pay | Admitting: Family Medicine

## 2015-01-29 ENCOUNTER — Ambulatory Visit (INDEPENDENT_AMBULATORY_CARE_PROVIDER_SITE_OTHER): Payer: Medicare Other | Admitting: Family Medicine

## 2015-01-29 VITALS — BP 128/76 | HR 53 | Temp 97.7°F | Ht 64.5 in | Wt 126.2 lb

## 2015-01-29 DIAGNOSIS — R413 Other amnesia: Secondary | ICD-10-CM

## 2015-01-29 DIAGNOSIS — Z23 Encounter for immunization: Secondary | ICD-10-CM

## 2015-01-29 DIAGNOSIS — I10 Essential (primary) hypertension: Secondary | ICD-10-CM | POA: Diagnosis not present

## 2015-01-29 DIAGNOSIS — F0390 Unspecified dementia without behavioral disturbance: Secondary | ICD-10-CM | POA: Insufficient documentation

## 2015-01-29 DIAGNOSIS — M519 Unspecified thoracic, thoracolumbar and lumbosacral intervertebral disc disorder: Secondary | ICD-10-CM

## 2015-01-29 DIAGNOSIS — G3184 Mild cognitive impairment, so stated: Secondary | ICD-10-CM | POA: Diagnosis not present

## 2015-01-29 DIAGNOSIS — K222 Esophageal obstruction: Secondary | ICD-10-CM

## 2015-01-29 DIAGNOSIS — E7800 Pure hypercholesterolemia, unspecified: Secondary | ICD-10-CM

## 2015-01-29 LAB — COMPREHENSIVE METABOLIC PANEL
ALBUMIN: 3.8 g/dL (ref 3.5–5.2)
ALK PHOS: 49 U/L (ref 39–117)
ALT: 12 U/L (ref 0–35)
AST: 19 U/L (ref 0–37)
BUN: 19 mg/dL (ref 6–23)
CHLORIDE: 103 meq/L (ref 96–112)
CO2: 32 mEq/L (ref 19–32)
Calcium: 9.1 mg/dL (ref 8.4–10.5)
Creatinine, Ser: 1.12 mg/dL (ref 0.40–1.20)
GFR: 49.09 mL/min — ABNORMAL LOW (ref >60.00)
Glucose, Bld: 111 mg/dL — ABNORMAL HIGH (ref 70–99)
POTASSIUM: 4.3 meq/L (ref 3.5–5.1)
Sodium: 140 mEq/L (ref 135–145)
Total Bilirubin: 0.2 mg/dL (ref 0.2–1.2)
Total Protein: 6.4 g/dL (ref 6.0–8.3)

## 2015-01-29 LAB — CBC WITH DIFFERENTIAL/PLATELET
BASOS ABS: 0 10*3/uL (ref 0.0–0.1)
Basophils Relative: 0.3 % (ref 0.0–3.0)
EOS ABS: 0.1 10*3/uL (ref 0.0–0.7)
EOS PCT: 1.3 % (ref 0.0–5.0)
HCT: 35.5 % — ABNORMAL LOW (ref 36.0–46.0)
Hemoglobin: 11.6 g/dL — ABNORMAL LOW (ref 12.0–15.0)
Lymphocytes Relative: 30.8 % (ref 12.0–46.0)
Lymphs Abs: 2.3 10*3/uL (ref 0.7–4.0)
MCHC: 32.8 g/dL (ref 30.0–36.0)
MCV: 95.7 fl (ref 78.0–100.0)
MONO ABS: 0.5 10*3/uL (ref 0.1–1.0)
MONOS PCT: 6.1 % (ref 3.0–12.0)
NEUTROS PCT: 61.5 % (ref 43.0–77.0)
Neutro Abs: 4.6 10*3/uL (ref 1.4–7.7)
Platelets: 209 10*3/uL (ref 150.0–400.0)
RBC: 3.71 Mil/uL — ABNORMAL LOW (ref 3.87–5.11)
RDW: 13.1 % (ref 11.5–15.5)
WBC: 7.5 10*3/uL (ref 4.0–10.5)

## 2015-01-29 LAB — LIPID PANEL
CHOL/HDL RATIO: 5
Cholesterol: 199 mg/dL (ref 0–200)
HDL: 38.7 mg/dL — ABNORMAL LOW (ref >39.00)
NONHDL: 160.3
Triglycerides: 266 mg/dL — ABNORMAL HIGH (ref 0.0–149.0)
VLDL: 53.2 mg/dL — AB (ref 0.0–40.0)

## 2015-01-29 LAB — TSH: TSH: 1.78 u[IU]/mL (ref 0.35–4.50)

## 2015-01-29 LAB — LDL CHOLESTEROL, DIRECT: Direct LDL: 120 mg/dL

## 2015-01-29 LAB — VITAMIN B12: VITAMIN B 12: 275 pg/mL (ref 211–911)

## 2015-01-29 MED ORDER — OMEPRAZOLE 20 MG PO CPDR: 20.0000 mg | DELAYED_RELEASE_CAPSULE | Freq: Every day | ORAL | Status: DC

## 2015-01-29 MED ORDER — DONEPEZIL HCL 5 MG PO TABS: 5.0000 mg | ORAL_TABLET | Freq: Every day | ORAL | Status: DC

## 2015-01-29 NOTE — Patient Instructions (Addendum)
I think you are doing well, but experiencing some mild cognitive impairment (this is somewhat normal with aging- the loss of memory) Take a look at this information on Aricept- this is a medication that helps slow down age related memory loss -here is a px if you want to try it (here is a handout on the medicine and also the condition) --look at this with your daughter and decide if you want to try it  Get back on your prilosec - if the swallowing problems continue we will get you back to the GI doctor Your medicine list is on the printout  Since you tell me you do not need pain medicine right now - I will take tramadol off your list- but alert me if you think you need it  prevnar vaccine today (this is a pneumonia vaccine booster) Labs today  Your weight is up 2 lb which is good - but do eat a regular diet (do not overdo sweets)  Show all your paperwork to your daughter today since she helps set up your medication box   Follow up with me in about 3 months - we will review labs and see how you are doing with medication

## 2015-01-29 NOTE — Progress Notes (Signed)
Subjective:    Patient ID: Rachel Knox, female    DOB: [birth year], 81 y.o.   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 Needs to be on it  Not taking the extra hctz - bp is fine w/o it   BP Readings from Last 3 Encounters:  01/29/15 128/76  10/05/14 150/65  06/10/14 160/60    Not taking ca and vit D for bones   Patient Active Problem List   Diagnosis Date Noted  . UTI (urinary tract infection) 05/29/2014  . Acute sinusitis 11/10/2013  . Memory difficulties 06/27/2013  . Loss of weight 06/27/2013  . Stress reaction 06/27/2013  . Other screening mammogram 10/12/2010  . Osteoarthritis 10/12/2010  . Preventative health care 10/12/2010  . Low back pain 09/29/2009  . ABNORMAL FINDINGS GI TRACT 12/10/2008  . HYPERCHOLESTEROLEMIA, PURE 02/01/2007  . HYPERTENSION, ESSENTIAL NOS 02/01/2007  . CLOSTRIDIUM DIFFICILE COLITIS 01/22/2007  . ESOPHAGEAL STRICTURE 01/22/2007  . IBS 01/22/2007  . Lumbar disc disease 01/22/2007   Past Medical History  Diagnosis Date  . Diverticulosis of colon   . Hyperlipidemia   . Hypertension   . Osteoarthritis   . Urinary incontinence   . IBS (irritable bowel syndrome)    Past Surgical History    Procedure Laterality Date  . Appendectomy      with infection (surgery x2)  . Abdominal hysterectomy    . Tonsillectomy    . Bladder surgery      bladder tack  . Foot neuroma surgery    . Breast surgery      breast cyst needle biopsy  . Breast cyst excision  08/2002    breast discharge, cyst removed  . Shoulder surgery      spur   History  Substance Use Topics  . Smoking status: Former Games developer  . Smokeless tobacco: Never Used  . Alcohol Use: No   Family History  Problem Relation Age of Onset  . Cancer Mother     kidney cancer  . Heart disease Father     MI  . Cancer Sister     lung  . Heart disease Sister     MI   Allergies  Allergen Reactions  . Amlodipine Besylate     REACTION: swelling  . Atorvastatin     REACTION: unspecified  . Azithromycin     REACTION: nausea and vomiting  . Clarithromycin     REACTION: severe diarrhea  . Ibuprofen     REACTION: unspecified  . Levofloxacin  REACTION: diarrhea  . Piroxicam     REACTION: unspecified  . Metoprolol     Asymptomatic but severe bradycardia   Current Outpatient Prescriptions on File Prior to Visit  Medication Sig Dispense Refill  . hydrochlorothiazide (HYDRODIURIL) 25 MG tablet Take 1 tablet (25 mg total) by mouth daily. 30 tablet 5  . omeprazole (PRILOSEC) 20 MG capsule Take 1 capsule (20 mg total) by mouth daily. 90 capsule 3  . simvastatin (ZOCOR) 20 MG tablet TAKE ONE TABLET BY MOUTH ONCE DAILY 90 tablet 1  . traMADol (ULTRAM) 50 MG tablet TAKE ONE TO TWO TABLETS BY MOUTH EVERY 8 HOURS AS NEEDED 60 tablet 3  . valsartan-hydrochlorothiazide (DIOVAN-HCT) 160-12.5 MG per tablet Take 1 tablet by mouth daily. NEED TO MAKE ANNUAL PHYSICAL APPOINTMENT 90 tablet 0   No current facility-administered medications on file prior to visit.    Review of Systems Review of Systems  Constitutional: Negative for fever, appetite change, fatigue and unexpected weight change. (pos for favorable wt gain since last  visit) Eyes: Negative for pain and visual disturbance.  Respiratory: Negative for cough and shortness of breath.   Cardiovascular: Negative for cp or palpitations    Gastrointestinal: Negative for nausea, diarrhea and constipation.  Genitourinary: Negative for urgency and frequency.  Skin: Negative for pallor or rash   MSK pos for low back pain chronic  Neurological: Negative for weakness, light-headedness, numbness and headaches.  Hematological: Negative for adenopathy. Does not bruise/bleed easily.  Psychiatric/Behavioral: Negative for dysphoric mood. The patient is occ nervous, pos for memory problems - but she still works        Objective:   Physical Exam  Constitutional: She appears well-developed and well-nourished. No distress.  Well appearing elderly female  HENT:  Head: Normocephalic and atraumatic.  Mouth/Throat: Oropharynx is clear and moist.  Eyes: Conjunctivae and EOM are normal. Pupils are equal, round, and reactive to light.  Neck: Normal range of motion. Neck supple. No JVD present. Carotid bruit is not present. No thyromegaly present.  Cardiovascular: Normal rate, regular rhythm, normal heart sounds and intact distal pulses.  Exam reveals no gallop.   Pulmonary/Chest: Effort normal and breath sounds normal. No respiratory distress. She has no wheezes. She has no rales.  No crackles  Abdominal: Soft. Bowel sounds are normal. She exhibits no distension, no abdominal bruit and no mass. There is no tenderness.  Musculoskeletal: She exhibits no edema.  Lymphadenopathy:    She has no cervical adenopathy.  Neurological: She is alert. She has normal reflexes. No cranial nerve deficit. She exhibits normal muscle tone. Coordination normal.  Skin: Skin is warm and dry. No rash noted.  Psychiatric: She has a normal mood and affect. Her speech is tangential. Thought content is not paranoid. Cognition and memory are impaired. She expresses no homicidal and no suicidal ideation. She  exhibits abnormal recent memory.  Pt confabulates a bit and repeats questions At times seems confused about medication  Note problem with short term memory           Assessment & Plan:   Problem List Items Addressed This Visit    ESOPHAGEAL STRICTURE    Prev hx of gerd and "jackhammer esophagus" Pt forgot to renew her prilosec Will get her started back on this and also disc gi appt if needed Daughter's note added that baclofen has helped in the past -can disc at f/u  F/u 3 mo       Essential hypertension    bp in  fair control at this time  BP Readings from Last 1 Encounters:  01/29/15 128/76   No changes needed Disc lifstyle change with low sodium diet and exercise  Ok without the additional hctz and pt is not having swelling- so this was removed from med list       Relevant Orders   CBC with Differential/Platelet (Completed)   Comprehensive metabolic panel (Completed)   TSH (Completed)   Lipid panel (Completed)   HYPERCHOLESTEROLEMIA, PURE    Due for lab Disc goals for lipids and reasons to control them Rev labs with pt (from prev draw)  Rev low sat fat diet in detail       Relevant Orders   Lipid panel (Completed)   Lumbar disc disease    Pt has chronic back pain - states it does not stop her from doing anything however  She denies need for tramadol - she will let us know if she changes her mind       Memory difficulties    See assessment for MCI  Pt is in some denial  Did not do MMS exam at this visit in anticipation of defensiveness on the part of the pt - may do later if daughter acc her to an appt  F/u 3 mo  Trial of aricept  Safety disc  Her daughter is managing her meds (per pt)      Relevant Orders   Vitamin B12 (Completed)   Mild cognitive impairment - Primary    Disc opt of tx with aricept - to begin with 5 mg and titrate up as tolerated Pt agreed that slowing down memory loss would be helpful (she is in some denial and does tend to  confabulate and repeat herself during interview)  Via note pt did not see - daughter is quiet concerned about memory  Rev poss side eff Px and handout given -for pt to review with her daughter  F/u 1mo       Other Visit Diagnoses    Need for vaccination with 13-polyvalent pneumococcal conjugate vaccine        Relevant Orders    Pneumococcal conjugate vaccine 13-valent (Completed)

## 2015-01-29 NOTE — Progress Notes (Signed)
Pre visit review using our clinic review tool, if applicable. No additional management support is needed unless otherwise documented below in the visit note. 

## 2015-01-29 NOTE — Telephone Encounter (Signed)
Pt's daughter dropped off a private letter to Dr. Milinda Antisower.  Letter given directly to Dr. Milinda Antisower / lt

## 2015-01-30 NOTE — Assessment & Plan Note (Signed)
Disc opt of tx with aricept - to begin with 5 mg and titrate up as tolerated Pt agreed that slowing down memory loss would be helpful (she is in some denial and does tend to confabulate and repeat herself during interview)  Via note pt did not see - daughter is quiet concerned about memory  Rev poss side eff Px and handout given -for pt to review with her daughter  F/u 42mo

## 2015-01-30 NOTE — Assessment & Plan Note (Signed)
See assessment for MCI  Pt is in some denial  Did not do MMS exam at this visit in anticipation of defensiveness on the part of the pt - may do later if daughter acc her to an appt  F/u 3 mo  Trial of aricept  Safety disc  Her daughter is managing her meds (per pt)

## 2015-01-30 NOTE — Assessment & Plan Note (Signed)
bp in fair control at this time  BP Readings from Last 1 Encounters:  01/29/15 128/76   No changes needed Disc lifstyle change with low sodium diet and exercise  Ok without the additional hctz and pt is not having swelling- so this was removed from med list

## 2015-01-30 NOTE — Assessment & Plan Note (Signed)
Due for lab Disc goals for lipids and reasons to control them Rev labs with pt (from prev draw)  Rev low sat fat diet in detail

## 2015-01-30 NOTE — Assessment & Plan Note (Signed)
Pt has chronic back pain - states it does not stop her from doing anything however  She denies need for tramadol - she will let us know if she changes her mind

## 2015-01-30 NOTE — Assessment & Plan Note (Signed)
Prev hx of gerd and "jackhammer esophagus" Pt forgot to renew her prilosec Will get her started back on this and also disc gi appt if needed Daughter's note added that baclofen has helped in the past -can disc at f/u  F/u 3 mo

## 2015-02-10 ENCOUNTER — Encounter: Payer: Self-pay | Admitting: *Deleted

## 2015-02-27 ENCOUNTER — Other Ambulatory Visit: Payer: Self-pay | Admitting: Family Medicine

## 2015-03-26 DIAGNOSIS — H3531 Nonexudative age-related macular degeneration: Secondary | ICD-10-CM | POA: Diagnosis not present

## 2015-04-01 ENCOUNTER — Other Ambulatory Visit: Payer: Self-pay | Admitting: Family Medicine

## 2015-04-30 ENCOUNTER — Ambulatory Visit (INDEPENDENT_AMBULATORY_CARE_PROVIDER_SITE_OTHER): Payer: Medicare Other | Admitting: Family Medicine

## 2015-04-30 ENCOUNTER — Encounter: Payer: Self-pay | Admitting: Family Medicine

## 2015-04-30 VITALS — BP 118/78 | HR 64 | Temp 98.2°F | Ht 64.5 in | Wt 127.8 lb

## 2015-04-30 DIAGNOSIS — G3184 Mild cognitive impairment, so stated: Secondary | ICD-10-CM | POA: Diagnosis not present

## 2015-04-30 DIAGNOSIS — E538 Deficiency of other specified B group vitamins: Secondary | ICD-10-CM

## 2015-04-30 DIAGNOSIS — Z23 Encounter for immunization: Secondary | ICD-10-CM | POA: Diagnosis not present

## 2015-04-30 MED ORDER — VALSARTAN-HYDROCHLOROTHIAZIDE 160-12.5 MG PO TABS: ORAL_TABLET | ORAL | Status: DC

## 2015-04-30 MED ORDER — CYANOCOBALAMIN 1000 MCG/ML IJ SOLN
1000.0000 ug | Freq: Once | INTRAMUSCULAR | Status: AC
  Administered 2015-04-30: 1000 ug via INTRAMUSCULAR

## 2015-04-30 MED ORDER — SIMVASTATIN 20 MG PO TABS: 20.0000 mg | ORAL_TABLET | Freq: Every day | ORAL | Status: DC

## 2015-04-30 NOTE — Patient Instructions (Signed)
Since the Aricept gave you diarrhea - I took it off your medicine list  To preserve memory-stay active and social  Continue other medicines  Your B12 level is too low - so buy some vitamin B12 over the counter and take 1000 mcg daily  Also -we will give you a B12 shot daily   Follow up in 3-6 months for a visit and we will re check your B12 level

## 2015-04-30 NOTE — Progress Notes (Signed)
Subjective:    Patient ID: Rachel Knox, female    DOB: 05-05-1928, 81 y.o.   MRN: [id number]  HPI Here for f/u of chronic health problems   Has been feeling well overall   Disc Mild Cognitive Impairment at last visit - and px written for aricept 5 mg  She took it for a week -and it set off her diarrhea - gave it a good try    Had her re start omeprazole for hx of esoph stricture No problems   Wt is up 1 lb  Appetite- is pretty good   bp is stable today  No cp or palpitations or headaches or edema  No side effects to medicines  BP Readings from Last 3 Encounters:  04/30/15 118/78  01/29/15 128/76  10/05/14 150/65     B12 level low/normal  Could not contact anyone with results  Lab Results  Component Value Date   VITAMINB12 275 01/29/2015   sent a letter - and she said she did not get it   Thinks her memory is about the same  No events or accidents and her family has not mentioned it is worse Still working -no errors or problems     Chemistry      Component Value Date/Time   NA 140 01/29/2015 1440   NA 138 02/02/2014 1157   K 4.3 01/29/2015 1440   K 3.5 02/02/2014 1157   CL 103 01/29/2015 1440   CL 99 02/02/2014 1157   CO2 32 01/29/2015 1440   CO2 31 02/02/2014 1157   BUN 19 01/29/2015 1440   BUN 19* 02/02/2014 1157   CREATININE 1.12 01/29/2015 1440   CREATININE 1.17 02/02/2014 1157      Component Value Date/Time   CALCIUM 9.1 01/29/2015 1440   CALCIUM 9.2 02/02/2014 1157   ALKPHOS 49 01/29/2015 1440   ALKPHOS 80 02/02/2014 1157   AST 19 01/29/2015 1440   AST 25 02/02/2014 1157   ALT 12 01/29/2015 1440   ALT 20 02/02/2014 1157   BILITOT 0.2 01/29/2015 1440   BILITOT 0.6 02/02/2014 1157      Lab Results  Component Value Date   WBC 7.5 01/29/2015   HGB 11.6* 01/29/2015   HCT 35.5* 01/29/2015   MCV 95.7 01/29/2015   PLT 209.0 01/29/2015    Lab Results  Component Value Date   TSH 1.78 01/29/2015    Patient Active Problem List   Diagnosis  Date Noted  . B12 deficiency 04/30/2015  . Mild cognitive impairment 01/29/2015  . UTI (urinary tract infection) 05/29/2014  . Acute sinusitis 11/10/2013  . Memory difficulties 06/27/2013  . Loss of weight 06/27/2013  . Stress reaction 06/27/2013  . Other screening mammogram 10/12/2010  . Osteoarthritis 10/12/2010  . Preventative health care 10/12/2010  . Low back pain 09/29/2009  . ABNORMAL FINDINGS GI TRACT 12/10/2008  . HYPERCHOLESTEROLEMIA, PURE 02/01/2007  . Essential hypertension 02/01/2007  . CLOSTRIDIUM DIFFICILE COLITIS 01/22/2007  . ESOPHAGEAL STRICTURE 01/22/2007  . IBS 01/22/2007  . Lumbar disc disease 01/22/2007   Past Medical History  Diagnosis Date  . Diverticulosis of colon   . Hyperlipidemia   . Hypertension   . Osteoarthritis   . Urinary incontinence   . IBS (irritable bowel syndrome)    Past Surgical History  Procedure Laterality Date  . Appendectomy      with infection (surgery x2)  . Abdominal hysterectomy    . Tonsillectomy    . Bladder surgery  bladder tack  . Foot neuroma surgery    . Breast surgery      breast cyst needle biopsy  . Breast cyst excision  08/2002    breast discharge, cyst removed  . Shoulder surgery      spur   Social History  Substance Use Topics  . Smoking status: Former Games developer  . Smokeless tobacco: Never Used  . Alcohol Use: No   Family History  Problem Relation Age of Onset  . Cancer Mother     kidney cancer  . Heart disease Father     MI  . Cancer Sister     lung  . Heart disease Sister     MI   Allergies  Allergen Reactions  . Amlodipine Besylate     REACTION: swelling  . Atorvastatin     REACTION: unspecified  . Azithromycin     REACTION: nausea and vomiting  . Clarithromycin     REACTION: severe diarrhea  . Ibuprofen     REACTION: unspecified  . Levofloxacin     REACTION: diarrhea  . Piroxicam     REACTION: unspecified  . Metoprolol     Asymptomatic but severe bradycardia   Current  Outpatient Prescriptions on File Prior to Visit  Medication Sig Dispense Refill  . donepezil (ARICEPT) 5 MG tablet Take 1 tablet (5 mg total) by mouth at bedtime. 30 tablet 5  . omeprazole (PRILOSEC) 20 MG capsule Take 1 capsule (20 mg total) by mouth daily. 90 capsule 3  . simvastatin (ZOCOR) 20 MG tablet TAKE ONE TABLET BY MOUTH ONCE DAILY 90 tablet 1  . valsartan-hydrochlorothiazide (DIOVAN-HCT) 160-12.5 MG per tablet TAKE ONE TABLET BY MOUTH ONCE DAILY *NEEDS APPOINTMENT BEFORE FURTHER REFILLS* 90 tablet 0   No current facility-administered medications on file prior to visit.     Review of Systems Review of Systems  Constitutional: Negative for fever, appetite change, fatigue and unexpected weight change.  Eyes: Negative for pain and visual disturbance.  Respiratory: Negative for cough and shortness of breath.   Cardiovascular: Negative for cp or palpitations    Gastrointestinal: Negative for nausea, diarrhea and constipation.  Genitourinary: Negative for urgency and frequency.  Skin: Negative for pallor or rash   Neurological: Negative for weakness, light-headedness, numbness and headaches.  Hematological: Negative for adenopathy. Does not bruise/bleed easily.  Psychiatric/Behavioral: Negative for dysphoric mood. The patient is not nervous/anxious.  pos for short term memory loss/ MCI        Objective:   Physical Exam  Constitutional: She appears well-developed and well-nourished. No distress.  Well appearing elderly female   HENT:  Head: Normocephalic and atraumatic.  Mouth/Throat: Oropharynx is clear and moist.  Eyes: Conjunctivae and EOM are normal. Pupils are equal, round, and reactive to light.  Neck: Normal range of motion. Neck supple. No JVD present. Carotid bruit is not present. No thyromegaly present.  Cardiovascular: Normal rate, regular rhythm, normal heart sounds and intact distal pulses.  Exam reveals no gallop.   Pulmonary/Chest: Effort normal and breath sounds  normal. No respiratory distress. She has no wheezes. She has no rales.  No crackles  Abdominal: Soft. Bowel sounds are normal. She exhibits no distension, no abdominal bruit and no mass. There is no tenderness.  Musculoskeletal: She exhibits no edema.  Lymphadenopathy:    She has no cervical adenopathy.  Neurological: She is alert. She has normal reflexes. No cranial nerve deficit. She exhibits normal muscle tone. Coordination normal.  Skin: Skin is warm and dry.  No rash noted.  Psychiatric: She has a normal mood and affect. Her behavior is normal.  Baseline affect           Assessment & Plan:   Problem List Items Addressed This Visit      Digestive   B12 deficiency    Could not reach pt or family with her last lab results-recommended tx for low b12 Lab Results  Component Value Date   VITAMINB12 275 01/29/2015    Will give B12 inj today  She will begin 1000 mcg vit B12 daily otc  Continue to follow This may help cognition and energy level       Relevant Medications   cyanocobalamin ((VITAMIN B-12)) injection 1,000 mcg (Completed)     Nervous and Auditory   Mild cognitive impairment    Rev last visit  Pt is in some denial  Could not tolerate 5 mg of aricept due to profuse diarrhea  Does not want to try other options for memory  Family not present today  She is still working part time Will tx B12 def and continue to follow  F/u 3-6 mo        Other Visit Diagnoses    Need for influenza vaccination    -  Primary    Relevant Orders    Flu Vaccine QUAD 36+ mos PF IM (Fluarix & Fluzone Quad PF) (Completed)

## 2015-04-30 NOTE — Progress Notes (Signed)
Pre visit review using our clinic review tool, if applicable. No additional management support is needed unless otherwise documented below in the visit note. 

## 2015-05-02 NOTE — Assessment & Plan Note (Signed)
Could not reach pt or family with her last lab results-recommended tx for low b12 Lab Results  Component Value Date   VITAMINB12 275 01/29/2015    Will give B12 inj today  She will begin 1000 mcg vit B12 daily otc  Continue to follow This may help cognition and energy level

## 2015-05-02 NOTE — Assessment & Plan Note (Addendum)
Rev last visit  Pt is in some denial  Could not tolerate 5 mg of aricept due to profuse diarrhea  Does not want to try other options for memory  Family not present today  She is still working part time Will tx B12 def and continue to follow  F/u 3-6 mo

## 2015-05-07 ENCOUNTER — Ambulatory Visit: Payer: Medicare Other | Admitting: Family Medicine

## 2015-05-19 ENCOUNTER — Other Ambulatory Visit: Payer: Self-pay | Admitting: Family Medicine

## 2015-06-08 ENCOUNTER — Other Ambulatory Visit: Payer: Self-pay | Admitting: Family Medicine

## 2015-06-18 ENCOUNTER — Other Ambulatory Visit: Payer: Self-pay | Admitting: Family Medicine

## 2015-06-24 ENCOUNTER — Other Ambulatory Visit: Payer: Self-pay | Admitting: Family Medicine

## 2015-06-24 NOTE — Telephone Encounter (Signed)
Please have her f/u when able with a family member to review her meds (and have her bring them) 30 min appt if possible  Thanks

## 2015-06-24 NOTE — Telephone Encounter (Signed)
We have received multiple refill request for this medication (HCTZ) but per OV note in July med was d/c and she was to take just the valsartan, so I have been declining refill request but since this was the 4th time the pharmacy sent me a refill request for her HCTZ, I called the pt to see if she is requesting this medication, and if she is still taking it.   When speaking with pt she seemed very confused on what medications she should be taking. I asked her to get her bottles of meds she is taking so we could review them, this the meds she said she had:  Tramadol, Omeprazole, Protonix, donepezil and amoxicillin.   She then told me that the amoxicillin and donepezil were old bottles and she isn't sure if she is taking them but she thinks she is only taking the tramadol and omeprazol and protonix. I asked her about the valsartan and simvastatin and to check to see if she had a bottle of those meds. Pt checked and didn't have any bottles of valsartan or simvastatin but found an old bottle of HCTZ that was empty, but that's it. I advise pt I would let Dr. Milinda Antisower know about her confusion on what meds she should be taking

## 2015-06-29 NOTE — Telephone Encounter (Signed)
appt scheduled and pt advise to bring family who helps with meds, and to bring all of her meds to appt, pt verbalized understanding

## 2015-07-02 ENCOUNTER — Ambulatory Visit: Payer: Medicare Other | Admitting: Family Medicine

## 2015-07-02 ENCOUNTER — Ambulatory Visit (INDEPENDENT_AMBULATORY_CARE_PROVIDER_SITE_OTHER): Payer: Medicare Other | Admitting: Family Medicine

## 2015-07-02 ENCOUNTER — Encounter: Payer: Self-pay | Admitting: Family Medicine

## 2015-07-02 VITALS — BP 142/90 | HR 55 | Temp 97.7°F | Ht 64.5 in | Wt 133.5 lb

## 2015-07-02 DIAGNOSIS — K222 Esophageal obstruction: Secondary | ICD-10-CM

## 2015-07-02 DIAGNOSIS — E538 Deficiency of other specified B group vitamins: Secondary | ICD-10-CM | POA: Diagnosis not present

## 2015-07-02 DIAGNOSIS — M545 Low back pain, unspecified: Secondary | ICD-10-CM

## 2015-07-02 DIAGNOSIS — G3184 Mild cognitive impairment, so stated: Secondary | ICD-10-CM | POA: Diagnosis not present

## 2015-07-02 DIAGNOSIS — I10 Essential (primary) hypertension: Secondary | ICD-10-CM

## 2015-07-02 LAB — VITAMIN B12: Vitamin B-12: 945 pg/mL — ABNORMAL HIGH (ref 211–911)

## 2015-07-02 MED ORDER — CYANOCOBALAMIN 1000 MCG/ML IJ SOLN
1000.0000 ug | Freq: Once | INTRAMUSCULAR | Status: AC
  Administered 2015-07-02: 1000 ug via INTRAMUSCULAR

## 2015-07-02 NOTE — Progress Notes (Signed)
   Subjective:    Patient ID: Rachel Knox, female    DOB: 10-16-1927, 81 y.o.   MRN: [id number]  HPI  Here for f/u of chronic medical problems     Memory seems to be getting worse Dx with [facility] in the past  Was tried on 5 mg of aricept and that caused diarrhea -she stopped it and declined further tx More forgetful Does not always answer phone calls  Daughter is here today in waiting room   Has not had any falls    Came for the wrong appt time today   Lab Results  Component Value Date   VITAMINB12 275 01/29/2015   Had one shot and did not come for another (oct)   Confusion about medicines   Takes simvastatin daily (refilled in oct) Taking prilosec to prevent stricture - only takes as needed (refilled in the summer) Takes diovan hct for her bp (refilled in the fall)  Takes B12 orally 1000 mcg daily   Daughter fixes pill box  Watches to make sure she has taken everything - lives with her    Wt is up 6 lb with bmi of 22  Eating normally  No more swallowing problems    bp is stable today  No cp or palpitations or headaches or edema  No side effects to medicines  BP Readings from Last 3 Encounters:  07/02/15 142/90  04/30/15 118/78  01/29/15 128/76       Review of Systems     Objective:   Physical Exam        Assessment & Plan:

## 2015-07-02 NOTE — Patient Instructions (Addendum)
Checking your B12 level today and then we will give you another shot  We will see from there if you will need more shots or not  All your medicines are up to date  Take your stomach medicine every day to prevent swallowing problem (omeprazole) If you want to try different medicine to slow down memory loss - let me know   Make an appointment to evaluate your back  Tylenol is ok 2 regular strength up to every 4 hours as needed

## 2015-07-02 NOTE — Progress Notes (Signed)
Pre visit review using our clinic review tool, if applicable. No additional management support is needed unless otherwise documented below in the visit note. 

## 2015-07-04 NOTE — Assessment & Plan Note (Signed)
bp in fair control at this time  BP Readings from Last 1 Encounters:  07/02/15 142/90   No changes needed Disc lifstyle change with low sodium diet and exercise  Labs reviewed

## 2015-07-04 NOTE — Assessment & Plan Note (Signed)
This seems to be worsening  Pt denies much of a problem and does confabulate Daughter reiterates no safety concerns-she lives with her and admin her medications  Pt is forgetting appointments and short term things  Did not tolerate aricept  Declines further tx or neuro eval (daughter agrees) Contractornc brain activity and socialization  Continue to follow-stressing imp of safety to family >25 minutes spent in face to face time with patient, >50% spent in counselling or coordination of care

## 2015-07-04 NOTE — Assessment & Plan Note (Signed)
This is ongoing - will address further at next visit if not improved  Pt states it is not very bad right now

## 2015-07-04 NOTE — Assessment & Plan Note (Signed)
Swallowing much better and gaining weight for the first time in a while  Enc her to take her PPI daily instead of prn  Made sure daughter is aware (who sets up her medicines)

## 2015-07-04 NOTE — Assessment & Plan Note (Signed)
Level today Pt takes 1000 mcg daily  Also had one b12 shot  Will give another after labs drawn today  This may affect memory  Advise from there

## 2015-07-08 ENCOUNTER — Other Ambulatory Visit: Payer: Self-pay | Admitting: Family Medicine

## 2015-07-20 ENCOUNTER — Other Ambulatory Visit: Payer: Self-pay | Admitting: Family Medicine

## 2015-07-20 NOTE — Telephone Encounter (Signed)
Please call pt's daughter (who lives with her) - to clarify Neither is on med list  No longer on augmentin -that was for a sinus infection a while back  Pt told me she no longer takes tramadol (pain med)   Thanks

## 2015-07-20 NOTE — Telephone Encounter (Signed)
Electronic refill request, both meds are not on med list, please see OV notes from 07/02/15, pt was very confused about meds she was on last time, please advise

## 2015-07-21 ENCOUNTER — Other Ambulatory Visit: Payer: Self-pay | Admitting: Family Medicine

## 2015-07-21 IMAGING — CR DG CHEST 2V
1 series · 2 of 2 positions shown · non-contrast
Comparison: none

REASON FOR EXAM: food bolus eval
COMMENTS:

[Series 1: w chest pa · 0.14mm/px · 2 of 2 slices shown]
[im 1/2]
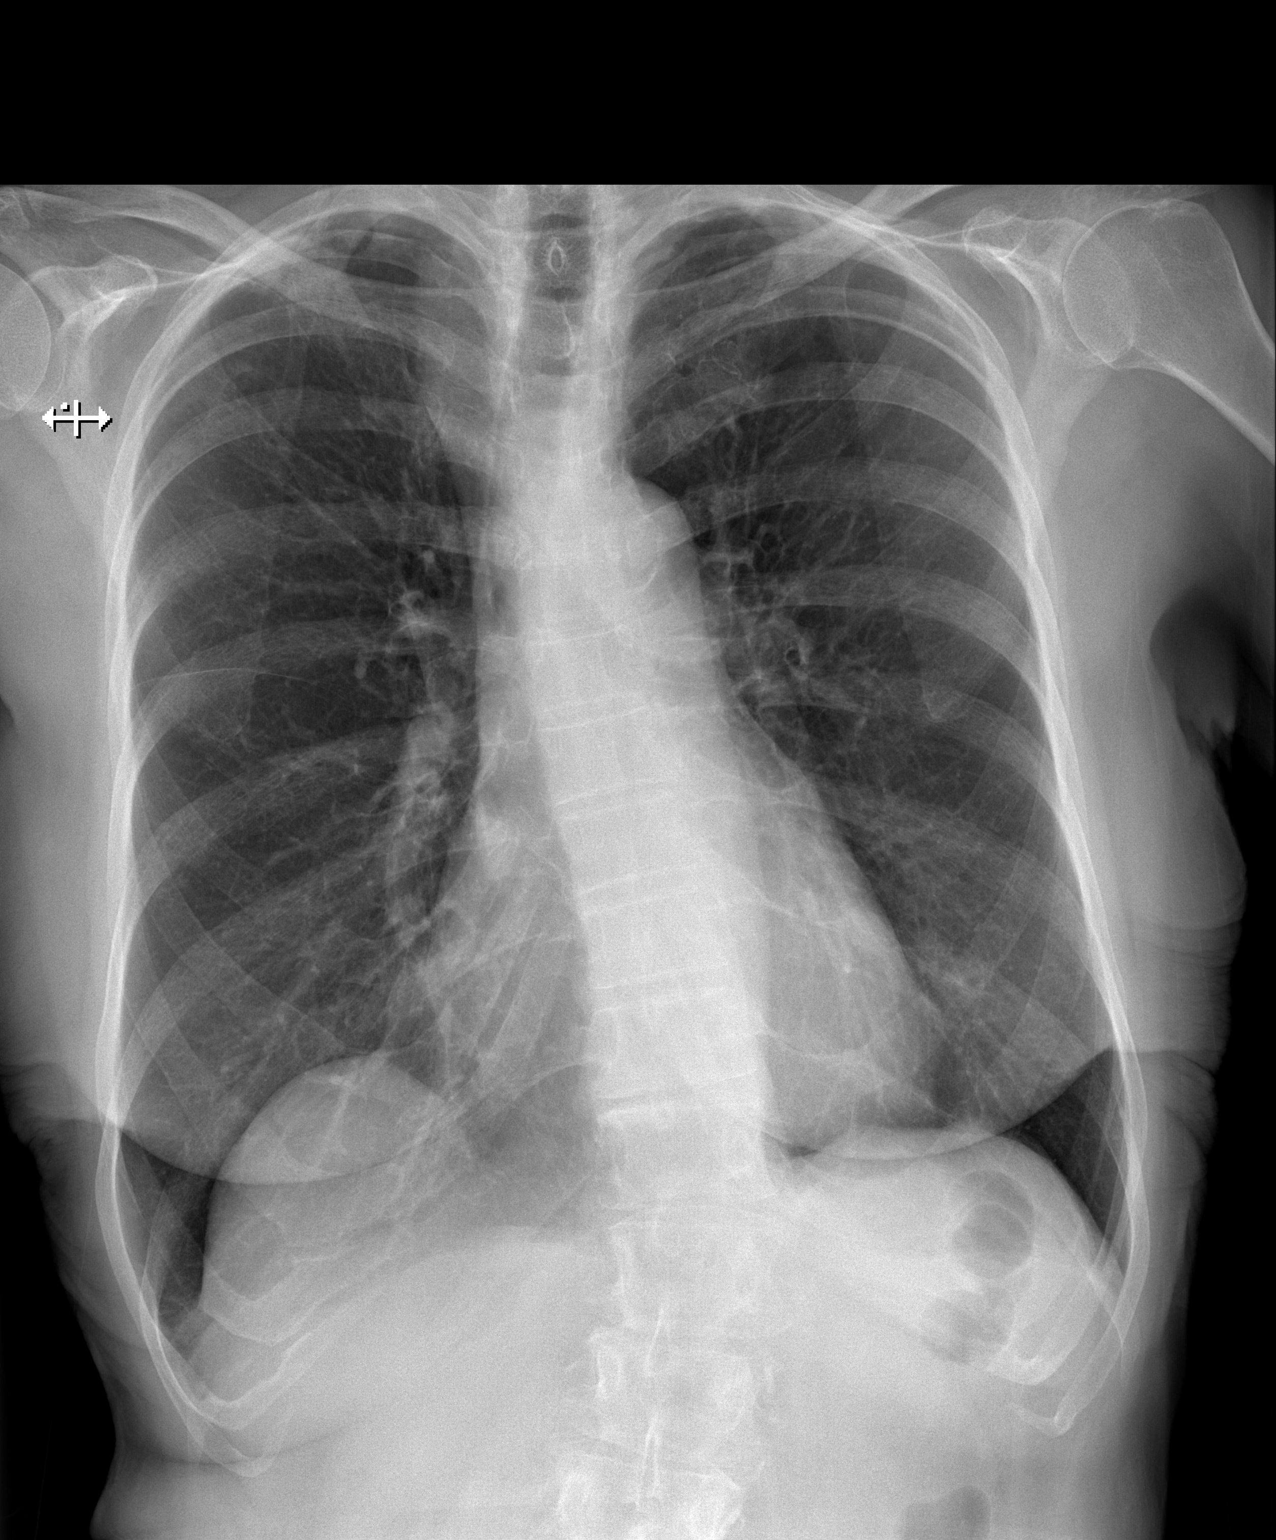
[im 2/2]
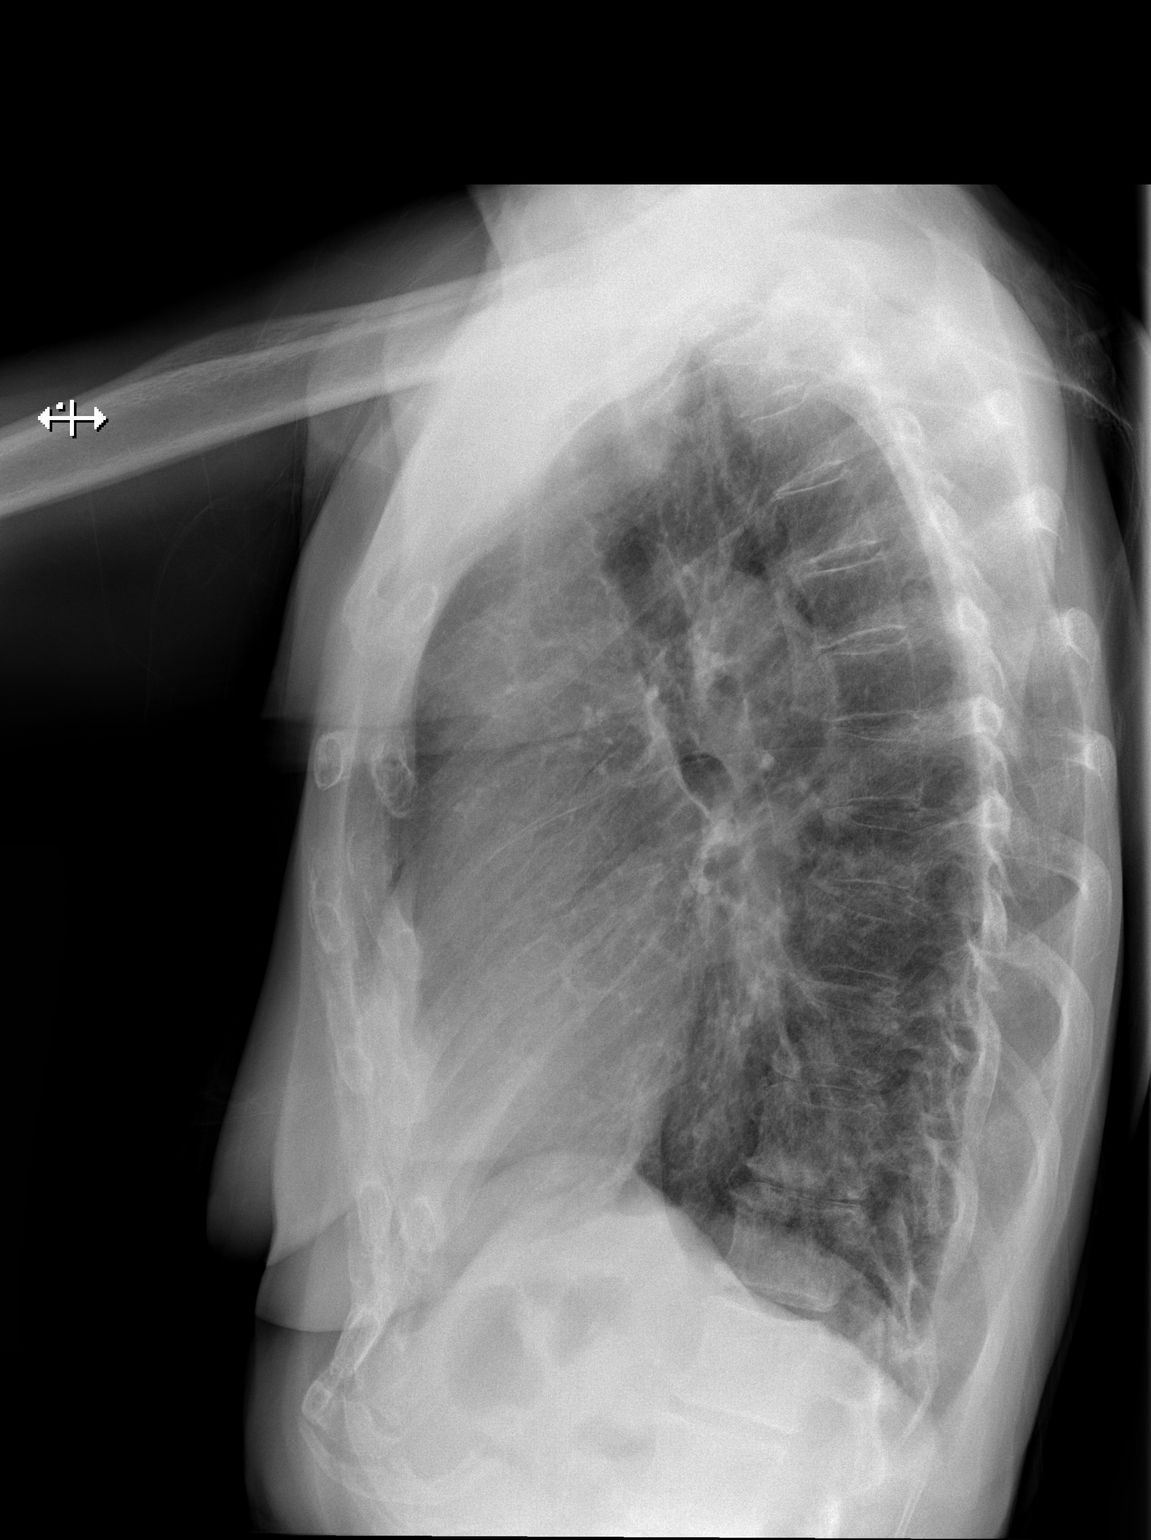

[2 of 2 positions shown; findings below may reference images not displayed]

PROCEDURE:     DXR - DXR CHEST PA (OR AP) AND LATERAL  - April 07, 2013  [DATE]

RESULT:     Comparison is made to the study October 05, 2009.

The lungs are hyperinflated with hemidiaphragm flattening. There is no focal
infiltrate. There is no pneumothorax or pleural effusion. The cardiac
silhouette is normal in size. The mediastinum is normal in width. There is
no pulmonary vascular congestion. The trachea is midline. The bony thorax
exhibits no acute abnormalities.
IMPRESSION: The findings are consistent with COPD. There is no evidence
of acute cardiopulmonary abnormality.

[REDACTED]

## 2015-07-21 NOTE — Telephone Encounter (Signed)
Rx called in as prescribed and pt/daughter notified of fall/sedation risk

## 2015-07-21 NOTE — Telephone Encounter (Signed)
Spoke with daughter and she wasn't sure if pt requested a refill of her Augmentin but she isn't on it and she will make sure she doesn't have any empty bottles she is requesting refills off of. Daughter said that pt does take the tramadol on a regular basis for her back pain and she does need a refill of that, she said that pt told you in error that she wasn't taking but daughter said she is taking it.  Rx request for Augmentin declined, please advise on the Tramadol

## 2015-07-21 NOTE — Telephone Encounter (Signed)
Px written for call in   Please tell her daughter to watch closely for sedation and falls on tramadol -thanks

## 2015-08-31 IMAGING — CR DG CHEST 1V PORT
1 series · 1 of 1 positions shown · non-contrast
Comparison: none

REASON FOR EXAM: esophageal foreign body, chest pain
COMMENTS:

PROCEDURE:     DXR - DXR PORTABLE CHEST SINGLE VIEW  - May 18, 2013  [DATE]
RESULT:     The lungs are clear. The cardiac silhouette and visualized bony
skeleton are unremarkable.

[ap]
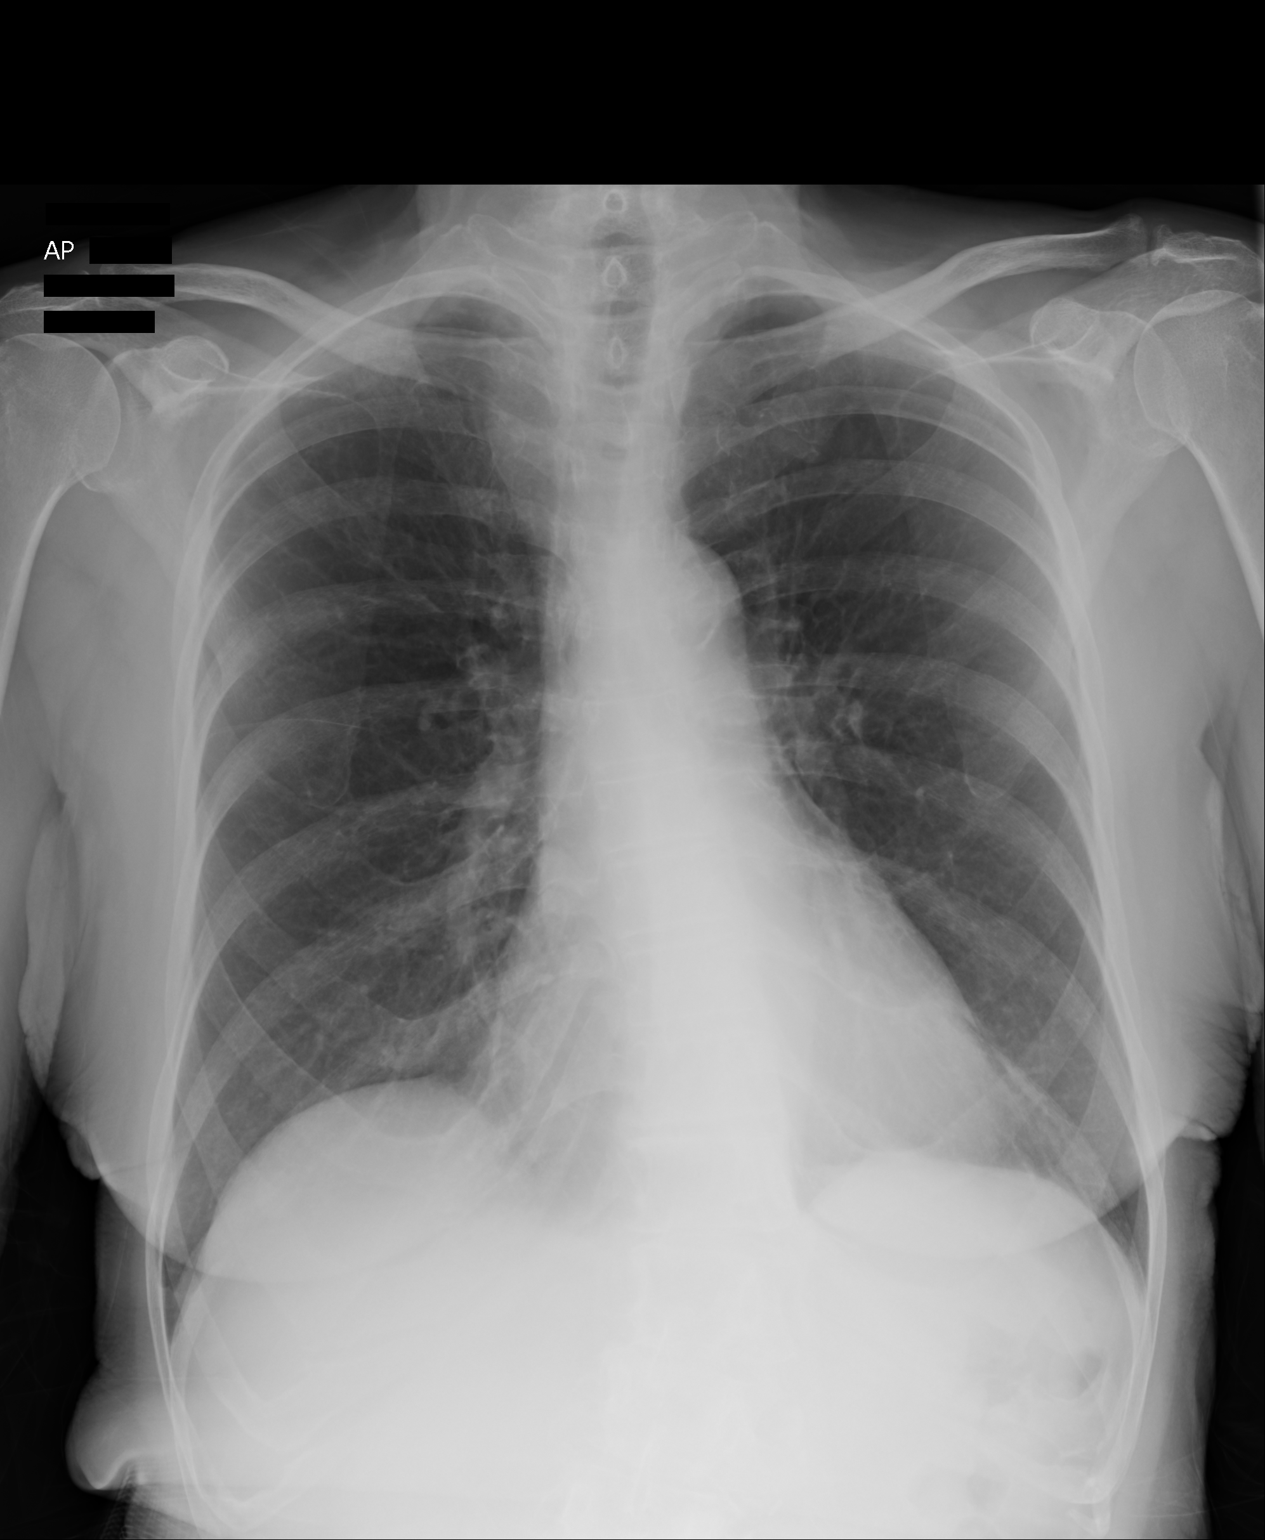

[1 of 1 positions shown; findings below may reference images not displayed]

IMPRESSION: 1. Chest radiograph without evidence of acute cardiopulmonary disease.
Comparison 04/07/2013

## 2015-09-24 DIAGNOSIS — H353132 Nonexudative age-related macular degeneration, bilateral, intermediate dry stage: Secondary | ICD-10-CM | POA: Diagnosis not present

## 2016-01-18 ENCOUNTER — Ambulatory Visit (INDEPENDENT_AMBULATORY_CARE_PROVIDER_SITE_OTHER): Payer: Medicare Other | Admitting: Internal Medicine

## 2016-01-18 ENCOUNTER — Encounter: Payer: Self-pay | Admitting: Internal Medicine

## 2016-01-18 VITALS — BP 138/70 | HR 52 | Temp 97.8°F | Wt 134.5 lb

## 2016-01-18 DIAGNOSIS — M545 Low back pain, unspecified: Secondary | ICD-10-CM

## 2016-01-18 MED ORDER — TRAMADOL HCL 50 MG PO TABS: ORAL_TABLET | ORAL | Status: DC

## 2016-01-18 NOTE — Patient Instructions (Signed)
Low Back Sprain With Rehab A sprain is an injury in which a ligament is torn. The ligaments of the lower back are vulnerable to sprains. However, they are strong and require great force to be injured. These ligaments are important for stabilizing the spinal column. Sprains are classified into three categories. Grade 1 sprains cause pain, but the tendon is not lengthened. Grade 2 sprains include a lengthened ligament, due to the ligament being stretched or partially ruptured. With grade 2 sprains there is still function, although the function may be decreased. Grade 3 sprains involve a complete tear of the tendon or muscle, and function is usually impaired. SYMPTOMS   Severe pain in the lower back.  Sometimes, a feeling of a "pop," "snap," or tear, at the time of injury.  Tenderness and sometimes swelling at the injury site.  Uncommonly, bruising (contusion) within 48 hours of injury.  Muscle spasms in the back. CAUSES  Low back sprains occur when a force is placed on the ligaments that is greater than they can handle. Common causes of injury include:  Performing a stressful act while off-balance.  Repetitive stressful activities that involve movement of the lower back.  Direct hit (trauma) to the lower back. RISK INCREASES WITH:  Contact sports (football, wrestling).  Collisions (major skiing accidents).  Sports that require throwing or lifting (baseball, weightlifting).  Sports involving twisting of the spine (gymnastics, diving, tennis, golf).  Poor strength and flexibility.  Inadequate protection.  Previous back injury or surgery (especially fusion). PREVENTION  Wear properly fitted and padded protective equipment.  Warm up and stretch properly before activity.  Allow for adequate recovery between workouts.  Maintain physical fitness:  Strength, flexibility, and endurance.  Cardiovascular fitness.  Maintain a healthy body weight. PROGNOSIS  If treated properly,  low back sprains usually heal with non-surgical treatment. The length of time for healing depends on the severity of the injury.  RELATED COMPLICATIONS   Recurring symptoms, resulting in a chronic problem.  Chronic inflammation and pain in the low back.  Delayed healing or resolution of symptoms, especially if activity is resumed too soon.  Prolonged impairment.  Unstable or arthritic joints of the low back. TREATMENT  Treatment first involves the use of ice and medicine, to reduce pain and inflammation. The use of strengthening and stretching exercises may help reduce pain with activity. These exercises may be performed at home or with a therapist. Severe injuries may require referral to a therapist for further evaluation and treatment, such as ultrasound. Your caregiver may advise that you wear a back brace or corset, to help reduce pain and discomfort. Often, prolonged bed rest results in greater harm then benefit. Corticosteroid injections may be recommended. However, these should be reserved for the most serious cases. It is important to avoid using your back when lifting objects. At night, sleep on your back on a firm mattress, with a pillow placed under your knees. If non-surgical treatment is unsuccessful, surgery may be needed.  MEDICATION   If pain medicine is needed, nonsteroidal anti-inflammatory medicines (aspirin and ibuprofen), or other minor pain relievers (acetaminophen), are often advised.  Do not take pain medicine for 7 days before surgery.  Prescription pain relievers may be given, if your caregiver thinks they are needed. Use only as directed and only as much as you need.  Ointments applied to the skin may be helpful.  Corticosteroid injections may be given by your caregiver. These injections should be reserved for the most serious cases, because   they may only be given a certain number of times. HEAT AND COLD  Cold treatment (icing) should be applied for 10 to 15  minutes every 2 to 3 hours for inflammation and pain, and immediately after activity that aggravates your symptoms. Use ice packs or an ice massage.  Heat treatment may be used before performing stretching and strengthening activities prescribed by your caregiver, physical therapist, or athletic trainer. Use a heat pack or a warm water soak. SEEK MEDICAL CARE IF:   Symptoms get worse or do not improve in 2 to 4 weeks, despite treatment.  You develop numbness or weakness in either leg.  You lose bowel or bladder function.  Any of the following occur after surgery: fever, increased pain, swelling, redness, drainage of fluids, or bleeding in the affected area.  New, unexplained symptoms develop. (Drugs used in treatment may produce side effects.) EXERCISES  RANGE OF MOTION (ROM) AND STRETCHING EXERCISES - Low Back Sprain Most people with lower back pain will find that their symptoms get worse with excessive bending forward (flexion) or arching at the lower back (extension). The exercises that will help resolve your symptoms will focus on the opposite motion.  Your physician, physical therapist or athletic trainer will help you determine which exercises will be most helpful to resolve your lower back pain. Do not complete any exercises without first consulting with your caregiver. Discontinue any exercises which make your symptoms worse, until you speak to your caregiver. If you have pain, numbness or tingling which travels down into your buttocks, leg or foot, the goal of the therapy is for these symptoms to move closer to your back and eventually resolve. Sometimes, these leg symptoms will get better, but your lower back pain may worsen. This is often an indication of progress in your rehabilitation. Be very alert to any changes in your symptoms and the activities in which you participated in the 24 hours prior to the change. Sharing this information with your caregiver will allow him or her to most  efficiently treat your condition. These exercises may help you when beginning to rehabilitate your injury. Your symptoms may resolve with or without further involvement from your physician, physical therapist or athletic trainer. While completing these exercises, remember:   Restoring tissue flexibility helps normal motion to return to the joints. This allows healthier, less painful movement and activity.  An effective stretch should be held for at least 30 seconds.  A stretch should never be painful. You should only feel a gentle lengthening or release in the stretched tissue. FLEXION RANGE OF MOTION AND STRETCHING EXERCISES: STRETCH - Flexion, Single Knee to Chest   Lie on a firm bed or floor with both legs extended in front of you.  Keeping one leg in contact with the floor, bring your opposite knee to your chest. Hold your leg in place by either grabbing behind your thigh or at your knee.  Pull until you feel a gentle stretch in your low back. Hold __________ seconds.  Slowly release your grasp and repeat the exercise with the opposite side. Repeat __________ times. Complete this exercise __________ times per day.  STRETCH - Flexion, Double Knee to Chest  Lie on a firm bed or floor with both legs extended in front of you.  Keeping one leg in contact with the floor, bring your opposite knee to your chest.  Tense your stomach muscles to support your back and then lift your other knee to your chest. Hold your legs in   place by either grabbing behind your thighs or at your knees.  Pull both knees toward your chest until you feel a gentle stretch in your low back. Hold __________ seconds.  Tense your stomach muscles and slowly return one leg at a time to the floor. Repeat __________ times. Complete this exercise __________ times per day.  STRETCH - Low Trunk Rotation  Lie on a firm bed or floor. Keeping your legs in front of you, bend your knees so they are both pointed toward the  ceiling and your feet are flat on the floor.  Extend your arms out to the side. This will stabilize your upper body by keeping your shoulders in contact with the floor.  Gently and slowly drop both knees together to one side until you feel a gentle stretch in your low back. Hold for __________ seconds.  Tense your stomach muscles to support your lower back as you bring your knees back to the starting position. Repeat the exercise to the other side. Repeat __________ times. Complete this exercise __________ times per day  EXTENSION RANGE OF MOTION AND FLEXIBILITY EXERCISES: STRETCH - Extension, Prone on Elbows   Lie on your stomach on the floor, a bed will be too soft. Place your palms about shoulder width apart and at the height of your head.  Place your elbows under your shoulders. If this is too painful, stack pillows under your chest.  Allow your body to relax so that your hips drop lower and make contact more completely with the floor.  Hold this position for __________ seconds.  Slowly return to lying flat on the floor. Repeat __________ times. Complete this exercise __________ times per day.  RANGE OF MOTION - Extension, Prone Press Ups  Lie on your stomach on the floor, a bed will be too soft. Place your palms about shoulder width apart and at the height of your head.  Keeping your back as relaxed as possible, slowly straighten your elbows while keeping your hips on the floor. You may adjust the placement of your hands to maximize your comfort. As you gain motion, your hands will come more underneath your shoulders.  Hold this position __________ seconds.  Slowly return to lying flat on the floor. Repeat __________ times. Complete this exercise __________ times per day.  RANGE OF MOTION- Quadruped, Neutral Spine   Assume a hands and knees position on a firm surface. Keep your hands under your shoulders and your knees under your hips. You may place padding under your knees for  comfort.  Drop your head and point your tailbone toward the ground below you. This will round out your lower back like an angry cat. Hold this position for __________ seconds.  Slowly lift your head and release your tail bone so that your back sags into a large arch, like an old horse.  Hold this position for __________ seconds.  Repeat this until you feel limber in your low back.  Now, find your "sweet spot." This will be the most comfortable position somewhere between the two previous positions. This is your neutral spine. Once you have found this position, tense your stomach muscles to support your low back.  Hold this position for __________ seconds. Repeat __________ times. Complete this exercise __________ times per day.  STRENGTHENING EXERCISES - Low Back Sprain These exercises may help you when beginning to rehabilitate your injury. These exercises should be done near your "sweet spot." This is the neutral, low-back arch, somewhere between fully rounded and   fully arched, that is your least painful position. When performed in this safe range of motion, these exercises can be used for people who have either a flexion or extension based injury. These exercises may resolve your symptoms with or without further involvement from your physician, physical therapist or athletic trainer. While completing these exercises, remember:   Muscles can gain both the endurance and the strength needed for everyday activities through controlled exercises.  Complete these exercises as instructed by your physician, physical therapist or athletic trainer. Increase the resistance and repetitions only as guided.  You may experience muscle soreness or fatigue, but the pain or discomfort you are trying to eliminate should never worsen during these exercises. If this pain does worsen, stop and make certain you are following the directions exactly. If the pain is still present after adjustments, discontinue the  exercise until you can discuss the trouble with your caregiver. STRENGTHENING - Deep Abdominals, Pelvic Tilt   Lie on a firm bed or floor. Keeping your legs in front of you, bend your knees so they are both pointed toward the ceiling and your feet are flat on the floor.  Tense your lower abdominal muscles to press your low back into the floor. This motion will rotate your pelvis so that your tail bone is scooping upwards rather than pointing at your feet or into the floor. With a gentle tension and even breathing, hold this position for __________ seconds. Repeat __________ times. Complete this exercise __________ times per day.  STRENGTHENING - Abdominals, Crunches   Lie on a firm bed or floor. Keeping your legs in front of you, bend your knees so they are both pointed toward the ceiling and your feet are flat on the floor. Cross your arms over your chest.  Slightly tip your chin down without bending your neck.  Tense your abdominals and slowly lift your trunk high enough to just clear your shoulder blades. Lifting higher can put excessive stress on the lower back and does not further strengthen your abdominal muscles.  Control your return to the starting position. Repeat __________ times. Complete this exercise __________ times per day.  STRENGTHENING - Quadruped, Opposite UE/LE Lift   Assume a hands and knees position on a firm surface. Keep your hands under your shoulders and your knees under your hips. You may place padding under your knees for comfort.  Find your neutral spine and gently tense your abdominal muscles so that you can maintain this position. Your shoulders and hips should form a rectangle that is parallel with the floor and is not twisted.  Keeping your trunk steady, lift your right hand no higher than your shoulder and then your left leg no higher than your hip. Make sure you are not holding your breath. Hold this position for __________ seconds.  Continuing to keep  your abdominal muscles tense and your back steady, slowly return to your starting position. Repeat with the opposite arm and leg. Repeat __________ times. Complete this exercise __________ times per day.  STRENGTHENING - Abdominals and Quadriceps, Straight Leg Raise   Lie on a firm bed or floor with both legs extended in front of you.  Keeping one leg in contact with the floor, bend the other knee so that your foot can rest flat on the floor.  Find your neutral spine, and tense your abdominal muscles to maintain your spinal position throughout the exercise.  Slowly lift your straight leg off the floor about 6 inches for a count of   15, making sure to not hold your breath.  Still keeping your neutral spine, slowly lower your leg all the way to the floor. Repeat this exercise with each leg __________ times. Complete this exercise __________ times per day. POSTURE AND BODY MECHANICS CONSIDERATIONS - Low Back Sprain Keeping correct posture when sitting, standing or completing your activities will reduce the stress put on different body tissues, allowing injured tissues a chance to heal and limiting painful experiences. The following are general guidelines for improved posture. Your physician or physical therapist will provide you with any instructions specific to your needs. While reading these guidelines, remember:  The exercises prescribed by your provider will help you have the flexibility and strength to maintain correct postures.  The correct posture provides the best environment for your joints to work. All of your joints have less wear and tear when properly supported by a spine with good posture. This means you will experience a healthier, less painful body.  Correct posture must be practiced with all of your activities, especially prolonged sitting and standing. Correct posture is as important when doing repetitive low-stress activities (typing) as it is when doing a single heavy-load  activity (lifting). RESTING POSITIONS Consider which positions are most painful for you when choosing a resting position. If you have pain with flexion-based activities (sitting, bending, stooping, squatting), choose a position that allows you to rest in a less flexed posture. You would want to avoid curling into a fetal position on your side. If your pain worsens with extension-based activities (prolonged standing, working overhead), avoid resting in an extended position such as sleeping on your stomach. Most people will find more comfort when they rest with their spine in a more neutral position, neither too rounded nor too arched. Lying on a non-sagging bed on your side with a pillow between your knees, or on your back with a pillow under your knees will often provide some relief. Keep in mind, being in any one position for a prolonged period of time, no matter how correct your posture, can still lead to stiffness. PROPER SITTING POSTURE In order to minimize stress and discomfort on your spine, you must sit with correct posture. Sitting with good posture should be effortless for a healthy body. Returning to good posture is a gradual process. Many people can work toward this most comfortably by using various supports until they have the flexibility and strength to maintain this posture on their own. When sitting with proper posture, your ears will fall over your shoulders and your shoulders will fall over your hips. You should use the back of the chair to support your upper back. Your lower back will be in a neutral position, just slightly arched. You may place a small pillow or folded towel at the base of your lower back for  support.  When working at a desk, create an environment that supports good, upright posture. Without extra support, muscles tire, which leads to excessive strain on joints and other tissues. Keep these recommendations in mind: CHAIR:  A chair should be able to slide under your desk  when your back makes contact with the back of the chair. This allows you to work closely.  The chair's height should allow your eyes to be level with the upper part of your monitor and your hands to be slightly lower than your elbows. BODY POSITION  Your feet should make contact with the floor. If this is not possible, use a foot rest.  Keep your ears   over your shoulders. This will reduce stress on your neck and low back. INCORRECT SITTING POSTURES  If you are feeling tired and unable to assume a healthy sitting posture, do not slouch or slump. This puts excessive strain on your back tissues, causing more damage and pain. Healthier options include:  Using more support, like a lumbar pillow.  Switching tasks to something that requires you to be upright or walking.  Talking a brief walk.  Lying down to rest in a neutral-spine position. PROLONGED STANDING WHILE SLIGHTLY LEANING FORWARD  When completing a task that requires you to lean forward while standing in one place for a long time, place either foot up on a stationary 2-4 inch high object to help maintain the best posture. When both feet are on the ground, the lower back tends to lose its slight inward curve. If this curve flattens (or becomes too large), then the back and your other joints will experience too much stress, tire more quickly, and can cause pain. CORRECT STANDING POSTURES Proper standing posture should be assumed with all daily activities, even if they only take a few moments, like when brushing your teeth. As in sitting, your ears should fall over your shoulders and your shoulders should fall over your hips. You should keep a slight tension in your abdominal muscles to brace your spine. Your tailbone should point down to the ground, not behind your body, resulting in an over-extended swayback posture.  INCORRECT STANDING POSTURES  Common incorrect standing postures include a forward head, locked knees and/or an excessive  swayback. WALKING Walk with an upright posture. Your ears, shoulders and hips should all line-up. PROLONGED ACTIVITY IN A FLEXED POSITION When completing a task that requires you to bend forward at your waist or lean over a low surface, try to find a way to stabilize 3 out of 4 of your limbs. You can place a hand or elbow on your thigh or rest a knee on the surface you are reaching across. This will provide you more stability, so that your muscles do not tire as quickly. By keeping your knees relaxed, or slightly bent, you will also reduce stress across your lower back. CORRECT LIFTING TECHNIQUES DO :  Assume a wide stance. This will provide you more stability and the opportunity to get as close as possible to the object which you are lifting.  Tense your abdominals to brace your spine. Bend at the knees and hips. Keeping your back locked in a neutral-spine position, lift using your leg muscles. Lift with your legs, keeping your back straight.  Test the weight of unknown objects before attempting to lift them.  Try to keep your elbows locked down at your sides in order get the best strength from your shoulders when carrying an object.  Always ask for help when lifting heavy or awkward objects. INCORRECT LIFTING TECHNIQUES DO NOT:   Lock your knees when lifting, even if it is a small object.  Bend and twist. Pivot at your feet or move your feet when needing to change directions.  Assume that you can safely pick up even a paperclip without proper posture.   This information is not intended to replace advice given to you by your health care provider. Make sure you discuss any questions you have with your health care provider.   Document Released: 07/10/2005 Document Revised: 07/31/2014 Document Reviewed: 10/22/2008 [employer] Patient Education 2016 [employer]  

## 2016-01-18 NOTE — Progress Notes (Signed)
Pre visit review using our clinic review tool, if applicable. No additional management support is needed unless otherwise documented below in the visit note. 

## 2016-01-18 NOTE — Progress Notes (Signed)
Subjective:    Patient ID: Rachel Knox, female    DOB: Aug 30, 1927, 82 y.o.   MRN: [id number]  HPI  Pt presents to the clinic today with c/o low back pain. This started years ago, but intermittently gets worse. She describes the pain as achy. The pain does not radiate. She denies numbness or tingling in her legs. She denies loss of bowel or bladder. She denies urinary urgency, frequency or dysuria. She denies difficulty walking. She denies any injury to the area and has a history of low back pain. Xray from 09/2014, which showed:  IMPRESSION:  There is mild levocurvature of the thoracolumbar junction. There is mild degenerative disc and facet joint change as well.  She takes Aleve occassionaly with good relief. She has a RX for Tramadol that she takes only as needed for severe pain, but she ran out. She is requesting refill today.  Review of Systems      Past Medical History  Diagnosis Date  . Diverticulosis of colon   . Hyperlipidemia   . Hypertension   . Osteoarthritis   . Urinary incontinence   . IBS (irritable bowel syndrome)     Current Outpatient Prescriptions  Medication Sig Dispense Refill  . omeprazole (PRILOSEC) 20 MG capsule Take 1 capsule (20 mg total) by mouth daily. 90 capsule 3  . simvastatin (ZOCOR) 20 MG tablet Take 1 tablet (20 mg total) by mouth daily. 90 tablet 3  . traMADol (ULTRAM) 50 MG tablet TAKE ONE TO TWO TABLETS BY MOUTH EVERY 8 HOURS AS NEEDED 60 tablet 0  . valsartan-hydrochlorothiazide (DIOVAN-HCT) 160-12.5 MG tablet TAKE ONE TABLET BY MOUTH ONCE DAILY 90 tablet 3  . vitamin B-12 (CYANOCOBALAMIN) 1000 MCG tablet Take 1,000 mcg by mouth daily.     No current facility-administered medications for this visit.    Allergies  Allergen Reactions  . Amlodipine Besylate     REACTION: swelling  . Aricept [Donepezil Hcl] Diarrhea  . Atorvastatin     REACTION: unspecified  . Azithromycin     REACTION: nausea and vomiting  . Clarithromycin    REACTION: severe diarrhea  . Ibuprofen     REACTION: unspecified  . Levofloxacin     REACTION: diarrhea  . Piroxicam     REACTION: unspecified  . Metoprolol     Asymptomatic but severe bradycardia    Family History  Problem Relation Age of Onset  . Cancer Mother     kidney cancer  . Heart disease Father     MI  . Cancer Sister     lung  . Heart disease Sister     MI    Social History   Social History  . Marital Status: Divorced    Spouse Name: N/A  . Number of Children: N/A  . Years of Education: N/A   Occupational History  . Not on file.   Social History Main Topics  . Smoking status: Former Games developermoker  . Smokeless tobacco: Never Used  . Alcohol Use: No  . Drug Use: No  . Sexual Activity: Not on file   Other Topics Concern  . Not on file   Social History Narrative     Constitutional: Denies fever, malaise, fatigue, headache or abrupt weight changes.  HEENT: Denies eye pain, eye redness, ear pain, ringing in the ears, wax buildup, runny nose, nasal congestion, bloody nose, or sore throat. Gastrointestinal: Denies abdominal pain, bloating, constipation, diarrhea or blood in the stool.  GU: Denies urgency, frequency, pain  with urination, burning sensation, blood in urine, odor or discharge. Musculoskeletal: Pt reports low back pain. Denies  difficulty with gait, muscle pain or joint swelling.   No other specific complaints in a complete review of systems (except as listed in HPI above).  Objective:   Physical Exam    BP 138/70 mmHg  Pulse 52  Temp(Src) 97.8 F (36.6 C) (Oral)  Wt 134 lb 8 oz (61.009 kg)  SpO2 95%  LMP 07/24/1980 Wt Readings from Last 3 Encounters:  01/18/16 134 lb 8 oz (61.009 kg)  07/02/15 133 lb 8 oz (60.555 kg)  04/30/15 127 lb 12 oz (57.947 kg)    General: Appears her stated age, in NAD. Abdomen: Soft and nontender. No CVA tenderness noted. Musculoskeletal: Decreased flexion of the spine. Normal extension and rotation of the  spine. Pain with palpation of the lumbar spine. Strength equal in BLE. No difficulty with gait.  Neurological: Alert and oriented.   BMET    Component Value Date/Time   NA 140 01/29/2015 1440   NA 138 02/02/2014 1157   K 4.3 01/29/2015 1440   K 3.5 02/02/2014 1157   CL 103 01/29/2015 1440   CL 99 02/02/2014 1157   CO2 32 01/29/2015 1440   CO2 31 02/02/2014 1157   GLUCOSE 111* 01/29/2015 1440   GLUCOSE 88 02/02/2014 1157   BUN 19 01/29/2015 1440   BUN 19* 02/02/2014 1157   CREATININE 1.12 01/29/2015 1440   CREATININE 1.17 02/02/2014 1157   CALCIUM 9.1 01/29/2015 1440   CALCIUM 9.2 02/02/2014 1157   GFRNONAA 43* 02/02/2014 1157   GFRNONAA 57 04/24/2008 1040   GFRAA 50* 02/02/2014 1157   GFRAA 69 04/24/2008 1040    Lipid Panel     Component Value Date/Time   CHOL 199 01/29/2015 1440   TRIG 266.0* 01/29/2015 1440   HDL 38.70* 01/29/2015 1440   CHOLHDL 5 01/29/2015 1440   VLDL 53.2* 01/29/2015 1440   LDLCALC 114* 12/25/2012 1506    CBC    Component Value Date/Time   WBC 7.5 01/29/2015 1440   WBC 10.6 02/02/2014 1157   RBC 3.71* 01/29/2015 1440   RBC 4.15 02/02/2014 1157   HGB 11.6* 01/29/2015 1440   HGB 12.9 02/02/2014 1157   HCT 35.5* 01/29/2015 1440   HCT 39.2 02/02/2014 1157   PLT 209.0 01/29/2015 1440   PLT 237 02/02/2014 1157   MCV 95.7 01/29/2015 1440   MCV 95 02/02/2014 1157   MCH 31.0 02/02/2014 1157   MCHC 32.8 01/29/2015 1440   MCHC 32.8 02/02/2014 1157   RDW 13.1 01/29/2015 1440   RDW 12.4 02/02/2014 1157   LYMPHSABS 2.3 01/29/2015 1440   LYMPHSABS 2.8 02/02/2014 1157   MONOABS 0.5 01/29/2015 1440   MONOABS 0.4 02/02/2014 1157   EOSABS 0.1 01/29/2015 1440   EOSABS 0.1 02/02/2014 1157   BASOSABS 0.0 01/29/2015 1440   BASOSABS 0.1 02/02/2014 1157    Hgb A1C No results found for: HGBA1C     Assessment & Plan:   Low back pain:  Chronic issue Advised her to take Aleve once daily Tramadol refilled for severe pain only No need to repeat  xray at this time No concern for any urinary involvement  Follow up with PCP if pain persist or worsen   Nicki ReaperBAITY, Miryah Ralls, NP

## 2016-02-11 ENCOUNTER — Other Ambulatory Visit: Payer: Self-pay | Admitting: Family Medicine

## 2016-02-11 NOTE — Telephone Encounter (Signed)
Electronic refill request, pt has had a recent acute appt with a provider but last OV with Dr. Milinda Antisower was dec 2016 and no future appt., last filled on 01/18/16 #60 with 0 refills (? If too soon), please advise

## 2016-02-11 NOTE — Telephone Encounter (Signed)
Can refill now since she sometimes takes more than bid  Px written for call in

## 2016-02-11 NOTE — Telephone Encounter (Signed)
Rx called in as prescribed 

## 2016-03-02 ENCOUNTER — Other Ambulatory Visit: Payer: Self-pay | Admitting: Family Medicine

## 2016-03-20 ENCOUNTER — Ambulatory Visit (INDEPENDENT_AMBULATORY_CARE_PROVIDER_SITE_OTHER)
Admission: RE | Admit: 2016-03-20 | Discharge: 2016-03-20 | Disposition: A | Discharge status: Home / Self Care | Payer: Medicare Other | Source: Ambulatory Visit | Attending: Family Medicine | Admitting: Family Medicine

## 2016-03-20 ENCOUNTER — Ambulatory Visit (INDEPENDENT_AMBULATORY_CARE_PROVIDER_SITE_OTHER): Payer: Medicare Other | Admitting: Family Medicine

## 2016-03-20 ENCOUNTER — Encounter: Payer: Self-pay | Admitting: Family Medicine

## 2016-03-20 VITALS — BP 134/72 | HR 45 | Temp 97.7°F | Ht 64.5 in | Wt 134.8 lb

## 2016-03-20 DIAGNOSIS — M419 Scoliosis, unspecified: Secondary | ICD-10-CM | POA: Diagnosis not present

## 2016-03-20 DIAGNOSIS — I10 Essential (primary) hypertension: Secondary | ICD-10-CM

## 2016-03-20 DIAGNOSIS — J302 Other seasonal allergic rhinitis: Secondary | ICD-10-CM

## 2016-03-20 DIAGNOSIS — M545 Low back pain: Secondary | ICD-10-CM | POA: Diagnosis not present

## 2016-03-20 DIAGNOSIS — E7800 Pure hypercholesterolemia, unspecified: Secondary | ICD-10-CM | POA: Diagnosis not present

## 2016-03-20 DIAGNOSIS — M546 Pain in thoracic spine: Secondary | ICD-10-CM

## 2016-03-20 DIAGNOSIS — R8290 Unspecified abnormal findings in urine: Secondary | ICD-10-CM

## 2016-03-20 DIAGNOSIS — M519 Unspecified thoracic, thoracolumbar and lumbosacral intervertebral disc disorder: Secondary | ICD-10-CM

## 2016-03-20 LAB — COMPREHENSIVE METABOLIC PANEL
ALBUMIN: 4.2 g/dL (ref 3.5–5.2)
ALT: 9 U/L (ref 0–35)
AST: 14 U/L (ref 0–37)
Alkaline Phosphatase: 56 U/L (ref 39–117)
BUN: 22 mg/dL (ref 6–23)
CHLORIDE: 103 meq/L (ref 96–112)
CO2: 29 mEq/L (ref 19–32)
CREATININE: 1.26 mg/dL — AB (ref 0.40–1.20)
Calcium: 9 mg/dL (ref 8.4–10.5)
GFR: 42.73 mL/min — ABNORMAL LOW (ref >60.00)
Glucose, Bld: 82 mg/dL (ref 70–99)
Potassium: 4.3 mEq/L (ref 3.5–5.1)
SODIUM: 139 meq/L (ref 135–145)
Total Bilirubin: 0.2 mg/dL (ref 0.2–1.2)
Total Protein: 7.1 g/dL (ref 6.0–8.3)

## 2016-03-20 LAB — POC URINALSYSI DIPSTICK (AUTOMATED)
Bilirubin, UA: NEGATIVE
Glucose, UA: NEGATIVE
Ketones, UA: NEGATIVE
NITRITE UA: NEGATIVE
PH UA: 6
SPEC GRAV UA: 1.025
UROBILINOGEN UA: 0.2

## 2016-03-20 LAB — CBC WITH DIFFERENTIAL/PLATELET
BASOS ABS: 0 10*3/uL (ref 0.0–0.1)
BASOS PCT: 0.3 % (ref 0.0–3.0)
EOS ABS: 0.1 10*3/uL (ref 0.0–0.7)
Eosinophils Relative: 1.7 % (ref 0.0–5.0)
HCT: 38.2 % (ref 36.0–46.0)
HEMOGLOBIN: 12.8 g/dL (ref 12.0–15.0)
Lymphocytes Relative: 36.2 % (ref 12.0–46.0)
Lymphs Abs: 2.5 10*3/uL (ref 0.7–4.0)
MCHC: 33.6 g/dL (ref 30.0–36.0)
MCV: 92.3 fl (ref 78.0–100.0)
MONO ABS: 0.4 10*3/uL (ref 0.1–1.0)
Monocytes Relative: 5.7 % (ref 3.0–12.0)
Neutro Abs: 3.8 10*3/uL (ref 1.4–7.7)
Neutrophils Relative %: 56.1 % (ref 43.0–77.0)
Platelets: 190 10*3/uL (ref 150.0–400.0)
RBC: 4.14 Mil/uL (ref 3.87–5.11)
RDW: 13.7 % (ref 11.5–15.5)
WBC: 6.8 10*3/uL (ref 4.0–10.5)

## 2016-03-20 LAB — LIPID PANEL
CHOL/HDL RATIO: 7
CHOLESTEROL: 285 mg/dL — AB (ref 0–200)
HDL: 43.7 mg/dL (ref >39.00)
NONHDL: 241.78
Triglycerides: 213 mg/dL — ABNORMAL HIGH (ref 0.0–149.0)
VLDL: 42.6 mg/dL — ABNORMAL HIGH (ref 0.0–40.0)

## 2016-03-20 LAB — LDL CHOLESTEROL, DIRECT: Direct LDL: 192 mg/dL

## 2016-03-20 LAB — TSH: TSH: 5.01 u[IU]/mL — AB (ref 0.35–4.50)

## 2016-03-20 MED ORDER — VALSARTAN-HYDROCHLOROTHIAZIDE 160-12.5 MG PO TABS: ORAL_TABLET | ORAL | 3 refills | Status: DC

## 2016-03-20 NOTE — Assessment & Plan Note (Signed)
Ongoing  Hx of facet change and lumbar disc dz  Xray today to document change  Declines PT  Tramadol prn

## 2016-03-20 NOTE — Progress Notes (Signed)
Pre visit review using our clinic review tool, if applicable. No additional management support is needed unless otherwise documented below in the visit note. 

## 2016-03-20 NOTE — Progress Notes (Signed)
Subjective:    Patient ID: Rachel Knox, female    DOB: 28-Jul-1927, 82 y.o.   MRN: [id number]  HPI Here for acute on chronic back pain    Has taken tramadol for it  (given 60 tab 7/21) Hx of mild levocurvature (thoracolumbar) Mid deg disc and facet change on xray 2016  Pt thinks pain is about the same - depends on what she does Taking breaks and resting helps  Worse when standing  She is currently out of her job - no more prolonged standing or lifting   No radiation to legs No numbness or weakness It slows her down at times    No falls or trauma at all  Does not use ben gay or other muscle rubs  Does use heat -and it helps   Declines physical therapy    Allergies are bothering her  pnd and sniffling    UA - trace blood/tr leuk and protein No urinary symptoms at all  She has baseline urge incontinence  No dysuria or bladder pain   For cholesterol- she was on simvastatin for cramps in her feet  bp is stable today  No cp or palpitations or headaches or edema  No side effects to medicines  BP Readings from Last 3 Encounters:  03/20/16 134/72  01/18/16 138/70  07/02/15 (!) 142/90    Due for refill of diovan hct    Chemistry      Component Value Date/Time   NA 140 01/29/2015 1440   NA 138 02/02/2014 1157   K 4.3 01/29/2015 1440   K 3.5 02/02/2014 1157   CL 103 01/29/2015 1440   CL 99 02/02/2014 1157   CO2 32 01/29/2015 1440   CO2 31 02/02/2014 1157   BUN 19 01/29/2015 1440   BUN 19 (H) 02/02/2014 1157   CREATININE 1.12 01/29/2015 1440   CREATININE 1.17 02/02/2014 1157      Component Value Date/Time   CALCIUM 9.1 01/29/2015 1440   CALCIUM 9.2 02/02/2014 1157   ALKPHOS 49 01/29/2015 1440   ALKPHOS 80 02/02/2014 1157   AST 19 01/29/2015 1440   AST 25 02/02/2014 1157   ALT 12 01/29/2015 1440   ALT 20 02/02/2014 1157   BILITOT 0.2 01/29/2015 1440   BILITOT 0.6 02/02/2014 1157       Wt Readings from Last 3 Encounters:  03/20/16 134 lb 12 oz  (61.1 kg)  01/18/16 134 lb 8 oz (61 kg)  07/02/15 133 lb 8 oz (60.6 kg)    Patient Active Problem List   Diagnosis Date Noted  . Thoracic back pain 03/20/2016  . Abnormal urinalysis 03/20/2016  . Allergic rhinitis, seasonal 03/20/2016  . B12 deficiency 04/30/2015  . Mild cognitive impairment 01/29/2015  . Memory difficulties 06/27/2013  . Loss of weight 06/27/2013  . Stress reaction 06/27/2013  . Other screening mammogram 10/12/2010  . Osteoarthritis 10/12/2010  . Preventative health care 10/12/2010  . Low back pain 09/29/2009  . ABNORMAL FINDINGS GI TRACT 12/10/2008  . HYPERCHOLESTEROLEMIA, PURE 02/01/2007  . Essential hypertension 02/01/2007  . ESOPHAGEAL STRICTURE 01/22/2007  . IBS 01/22/2007  . Lumbar disc disease 01/22/2007   Past Medical History:  Diagnosis Date  . Diverticulosis of colon   . Hyperlipidemia   . Hypertension   . IBS (irritable bowel syndrome)   . Osteoarthritis   . Urinary incontinence    Past Surgical History:  Procedure Laterality Date  . ABDOMINAL HYSTERECTOMY    . APPENDECTOMY  with infection (surgery x2)  . BLADDER SURGERY     bladder tack  . BREAST CYST EXCISION  08/2002   breast discharge, cyst removed  . BREAST SURGERY     breast cyst needle biopsy  . FOOT NEUROMA SURGERY    . SHOULDER SURGERY     spur  . TONSILLECTOMY     Social History  Substance Use Topics  . Smoking status: Former Games developer  . Smokeless tobacco: Never Used  . Alcohol use No   Family History  Problem Relation Age of Onset  . Cancer Mother     kidney cancer  . Heart disease Father     MI  . Cancer Sister     lung  . Heart disease Sister     MI   Allergies  Allergen Reactions  . Amlodipine Besylate     REACTION: swelling  . Aricept [Donepezil Hcl] Diarrhea  . Atorvastatin     REACTION: unspecified  . Azithromycin     REACTION: nausea and vomiting  . Clarithromycin     REACTION: severe diarrhea  . Ibuprofen     REACTION: unspecified  .  Levofloxacin     REACTION: diarrhea  . Piroxicam     REACTION: unspecified  . Simvastatin     Cramps in her feet  . Metoprolol     Asymptomatic but severe bradycardia   Current Outpatient Prescriptions on File Prior to Visit  Medication Sig Dispense Refill  . omeprazole (PRILOSEC) 20 MG capsule TAKE ONE CAPSULE BY MOUTH ONCE DAILY 30 capsule 2  . traMADol (ULTRAM) 50 MG tablet TAKE ONE TO TWO TABLETS BY MOUTH EVERY 8 HOURS AS NEEDED 60 tablet 0  . vitamin B-12 (CYANOCOBALAMIN) 1000 MCG tablet Take 1,000 mcg by mouth daily.     No current facility-administered medications on file prior to visit.     Review of Systems Review of Systems  Constitutional: Negative for fever, appetite change, fatigue and unexpected weight change.  Eyes: Negative for pain and visual disturbance.  ENT pos for rhinorrhea and pnd  Respiratory: Negative for cough and shortness of breath.   Cardiovascular: Negative for cp or palpitations    Gastrointestinal: Negative for nausea, diarrhea and constipation.  Genitourinary: Negative for urgency and frequency. pos for urge incontinence/ neg for dysuria or hematuria  MSK pos for mid and lower back pain w/o radiation  Skin: Negative for pallor or rash   Neurological: Negative for weakness, light-headedness, numbness and headaches.  Hematological: Negative for adenopathy. Does not bruise/bleed easily.  Psychiatric/Behavioral: Negative for dysphoric mood. The patient is not nervous/anxious.         Objective:   Physical Exam  Constitutional: She appears well-developed and well-nourished. No distress.  Frail appearing elderly female with mild cog impairment   HENT:  Head: Normocephalic and atraumatic.  Right Ear: External ear normal.  Left Ear: External ear normal.  Mouth/Throat: Oropharynx is clear and moist.  Nares are boggy Mild clear rhinorrhea and pnd  Throat is clear  Nl TMs Scant cerumen bilaterally No sinus tenderness  Eyes: Conjunctivae and EOM  are normal. Pupils are equal, round, and reactive to light.  Neck: Normal range of motion. Neck supple. No JVD present. Carotid bruit is not present. No thyromegaly present.  Cardiovascular: Normal rate, regular rhythm, normal heart sounds and intact distal pulses.  Exam reveals no gallop.   Pulmonary/Chest: Effort normal and breath sounds normal. No respiratory distress. She has no wheezes. She has no rales.  No  crackles  Abdominal: Soft. Bowel sounds are normal. She exhibits no distension, no abdominal bruit and no mass. There is no tenderness.  Musculoskeletal: She exhibits tenderness. She exhibits no edema.  Tender over mid TS (midline) Baseline thoracolumbar scoliosis No other point tenderness Tight lumbar musculature  Pain to fully flex (90deg) and also L lateral bend Gait is slow and steady  Lymphadenopathy:    She has no cervical adenopathy.  Neurological: She is alert. She has normal reflexes. She displays no atrophy. No cranial nerve deficit or sensory deficit. She exhibits normal muscle tone. Coordination and gait normal.  Skin: Skin is warm and dry. No rash noted. No erythema. No pallor.  Psychiatric: She has a normal mood and affect.  In good spirits-easily confused  Asks pertinent questions and attentive           Assessment & Plan:   Problem List Items Addressed This Visit      Cardiovascular and Mediastinum   Essential hypertension    bp in fair control at this time  BP Readings from Last 1 Encounters:  03/20/16 134/72   No changes needed Disc lifstyle change with low sodium diet and exercise  Labs today  Disc avoiding excess sodium  diovan hct refilled       Relevant Medications   valsartan-hydrochlorothiazide (DIOVAN-HCT) 160-12.5 MG tablet   Other Relevant Orders   CBC with Differential/Platelet   Comprehensive metabolic panel   Lipid panel   TSH     Respiratory   Allergic rhinitis, seasonal    Pt c/o runny nose and post nasal drip  Recommend  trial of claritin 10 mg otc daily  Allergen avoidance discussed         Musculoskeletal and Integument   Lumbar disc disease    Hx of chronic low back pain from this Takes tramadol with caution  Disc exercise - recommend PT -she declines Disc need for more frequent breaks  Heat prn  Walking  Xray today to document progression  No neuro c/o today        Other   Thoracic back pain    New thoracic tenderness noted (mid) today  Has scoliosis Xray to r/o compression fx       Relevant Orders   DG Thoracic Spine W/Swimmers   Low back pain - Primary    Ongoing  Hx of facet change and lumbar disc dz  Xray today to document change  Declines PT  Tramadol prn       Relevant Orders   POCT Urinalysis Dipstick (Automated) (Completed)   DG Lumbar Spine Complete   HYPERCHOLESTEROLEMIA, PURE    Lab today Intol to statins -stopped simvastatin due to foot cramps Disc goals for lipids and reasons to control them Rev labs with pt (from last time) Rev low sat fat diet in detail       Relevant Medications   valsartan-hydrochlorothiazide (DIOVAN-HCT) 160-12.5 MG tablet   Other Relevant Orders   Lipid panel   Abnormal urinalysis    Trace blood/leuk Doubt significant but will check cx  inst to keep up water intake       Relevant Orders   Urine culture    Other Visit Diagnoses   None.

## 2016-03-20 NOTE — Patient Instructions (Addendum)
Get claritin 10 mg over the counter- you can take 1 pill daily as needed for allergies  For back pain - take frequent breaks  Use heat for 10 minutes at a time when needed Let us know if you want to try physical therapy at any time Xray today to make sure nothing new is going on - we will call with that result  Also we will send urine for a culture to make sure there is no bladder infection  Your blood pressure is stable  We will do your yearly labs for that and also cholesterol (I know you are off of your cholesterol medicine) Take care of yourself

## 2016-03-20 NOTE — Assessment & Plan Note (Signed)
bp in fair control at this time  BP Readings from Last 1 Encounters:  03/20/16 134/72   No changes needed Disc lifstyle change with low sodium diet and exercise  Labs today  Disc avoiding excess sodium  diovan hct refilled

## 2016-03-20 NOTE — Assessment & Plan Note (Signed)
New thoracic tenderness noted (mid) today  Has scoliosis Xray to r/o compression fx

## 2016-03-20 NOTE — Assessment & Plan Note (Addendum)
Hx of chronic low back pain from this Takes tramadol with caution  Disc exercise - recommend PT -she declines Disc need for more frequent breaks  Heat prn  Walking  Xray today to document progression  No neuro c/o today

## 2016-03-20 NOTE — Assessment & Plan Note (Signed)
Trace blood/leuk Doubt significant but will check cx  inst to keep up water intake

## 2016-03-20 NOTE — Assessment & Plan Note (Signed)
Lab today Intol to statins -stopped simvastatin due to foot cramps Disc goals for lipids and reasons to control them Rev labs with pt (from last time) Rev low sat fat diet in detail

## 2016-03-20 NOTE — Assessment & Plan Note (Signed)
Pt c/o runny nose and post nasal drip  Recommend trial of claritin 10 mg otc daily  Allergen avoidance discussed

## 2016-03-21 ENCOUNTER — Other Ambulatory Visit (INDEPENDENT_AMBULATORY_CARE_PROVIDER_SITE_OTHER): Payer: Medicare Other

## 2016-03-21 ENCOUNTER — Other Ambulatory Visit: Payer: Medicare Other

## 2016-03-21 DIAGNOSIS — R946 Abnormal results of thyroid function studies: Secondary | ICD-10-CM

## 2016-03-21 LAB — T4, FREE: Free T4: 0.49 ng/dL — ABNORMAL LOW (ref 0.60–1.60)

## 2016-03-21 LAB — URINE CULTURE

## 2016-03-22 ENCOUNTER — Telehealth: Payer: Self-pay | Admitting: *Deleted

## 2016-03-22 MED ORDER — LEVOTHYROXINE SODIUM 50 MCG PO TABS: 50.0000 ug | ORAL_TABLET | Freq: Every day | ORAL | 3 refills | Status: DC

## 2016-03-22 NOTE — Telephone Encounter (Signed)
-----   Message from Judy PimpleMarne A Tower, MD sent at 03/21/2016  5:55 PM EDT ----- Please result to family- her memory is poor She has low thyroid function (hypothyroid) -this can cause fatigue and sluggishness Please call in levothyroxine 50 mcg 1 po qd in am (preferably before breakfast and 20-30 min before other meds) #30 3 ref Check tsh and cmet and lipid in 6 weeks

## 2016-03-22 NOTE — Telephone Encounter (Signed)
Pt notified of lab results and Dr. Tower's comments Rx sent to pharmacy and lab appt scheduled  

## 2016-03-23 ENCOUNTER — Encounter: Payer: Self-pay | Admitting: *Deleted

## 2016-04-10 ENCOUNTER — Other Ambulatory Visit: Payer: Self-pay | Admitting: Family Medicine

## 2016-05-05 ENCOUNTER — Other Ambulatory Visit: Payer: Medicare Other

## 2016-05-16 ENCOUNTER — Other Ambulatory Visit (INDEPENDENT_AMBULATORY_CARE_PROVIDER_SITE_OTHER): Payer: Medicare Other

## 2016-05-16 DIAGNOSIS — E7800 Pure hypercholesterolemia, unspecified: Secondary | ICD-10-CM | POA: Diagnosis not present

## 2016-05-16 DIAGNOSIS — E039 Hypothyroidism, unspecified: Secondary | ICD-10-CM | POA: Diagnosis not present

## 2016-05-16 DIAGNOSIS — I10 Essential (primary) hypertension: Secondary | ICD-10-CM | POA: Diagnosis not present

## 2016-05-16 LAB — LIPID PANEL
CHOL/HDL RATIO: 7
Cholesterol: 291 mg/dL — ABNORMAL HIGH (ref 0–200)
HDL: 41.3 mg/dL (ref >39.00)
LDL Cholesterol: 212 mg/dL — ABNORMAL HIGH (ref 0–99)
NONHDL: 250.1
Triglycerides: 191 mg/dL — ABNORMAL HIGH (ref 0.0–149.0)
VLDL: 38.2 mg/dL (ref 0.0–40.0)

## 2016-05-16 LAB — COMPREHENSIVE METABOLIC PANEL
ALK PHOS: 58 U/L (ref 39–117)
ALT: 20 U/L (ref 0–35)
AST: 27 U/L (ref 0–37)
Albumin: 4.3 g/dL (ref 3.5–5.2)
BILIRUBIN TOTAL: 0.3 mg/dL (ref 0.2–1.2)
BUN: 18 mg/dL (ref 6–23)
CO2: 30 mEq/L (ref 19–32)
Calcium: 9.7 mg/dL (ref 8.4–10.5)
Chloride: 103 mEq/L (ref 96–112)
Creatinine, Ser: 1.23 mg/dL — ABNORMAL HIGH (ref 0.40–1.20)
GFR: 43.92 mL/min — AB (ref >60.00)
GLUCOSE: 83 mg/dL (ref 70–99)
Potassium: 4.3 mEq/L (ref 3.5–5.1)
SODIUM: 141 meq/L (ref 135–145)
TOTAL PROTEIN: 7.3 g/dL (ref 6.0–8.3)

## 2016-05-16 LAB — TSH: TSH: 3.79 u[IU]/mL (ref 0.35–4.50)

## 2016-05-17 ENCOUNTER — Encounter: Payer: Self-pay | Admitting: *Deleted

## 2016-05-31 ENCOUNTER — Other Ambulatory Visit: Payer: Self-pay | Admitting: Family Medicine

## 2016-06-19 ENCOUNTER — Encounter: Payer: Self-pay | Admitting: Family Medicine

## 2016-06-19 ENCOUNTER — Ambulatory Visit (INDEPENDENT_AMBULATORY_CARE_PROVIDER_SITE_OTHER): Payer: Medicare Other | Admitting: Family Medicine

## 2016-06-19 VITALS — BP 124/80 | HR 50 | Temp 95.5°F | Ht 64.5 in | Wt 131.5 lb

## 2016-06-19 DIAGNOSIS — R8290 Unspecified abnormal findings in urine: Secondary | ICD-10-CM | POA: Diagnosis not present

## 2016-06-19 DIAGNOSIS — M545 Low back pain: Secondary | ICD-10-CM | POA: Diagnosis not present

## 2016-06-19 LAB — POC URINALSYSI DIPSTICK (AUTOMATED)
Bilirubin, UA: NEGATIVE
Glucose, UA: NEGATIVE
KETONES UA: NEGATIVE
NITRITE UA: NEGATIVE
PH UA: 6
RBC UA: NEGATIVE
Spec Grav, UA: 1.02
Urobilinogen, UA: 0.2

## 2016-06-19 NOTE — Progress Notes (Signed)
Subjective:    Patient ID: Rachel Knox, female    DOB: Dec 31, 1927, 82 y.o.   MRN: [id number]  HPI  Here for back pain Wanted to make sure she did not have kidney problems  No frequency/urgency or dysuria Occ incont only if she mis steps   Wt Readings from Last 3 Encounters:  06/19/16 131 lb 8 oz (59.6 kg)  03/20/16 134 lb 12 oz (61.1 kg)  01/18/16 134 lb 8 oz (61 kg)   bmi is 22.2 82 yo with MCI as well   Has tramadol for back pain   Has hx of deg disc/spine dz and also scoliosis   Last xr was in summer    PACS Images   Show images for DG Thoracic Spine W/Swimmers  Study Result   CLINICAL DATA:  Chronic back pain.  Scoliosis.  EXAM: THORACIC SPINE - 3 VIEWS  COMPARISON:  Chest x-ray 04/07/2013.  FINDINGS: Stable elevation right hemidiaphragm. Thoracic spine scoliosis concave right. Stable mild mid thoracic spine compression fractures noted. No change from 04/07/2013. No acute bony abnormality identified.  IMPRESSION: 1. Stable thoracolumbar spine scoliosis. Stable diffuse degenerative change.  2. Stable mild mid thoracic spine compression fractures. No acute abnormality. Thoracic spine is stable from prior study of 04/07/2013.   Electronically Signed   By: Maisie Fus  Register   On: 03/20/2016 13:35   Does not think her pain has changed since then  Both sides equally - middle to low back   ? What movements hurt the most Sitting is uncomfortable As a rule- she keeps busy  No falls lately  Not bad in am  Sleeps ok at night   Pain does not radiate to legs   Has not needed tramadol lately- occ takes at bedtime /last refill was in July   Urine today is fairly clear except for sm leuk Results for orders placed or performed in visit on 06/19/16  POCT Urinalysis Dipstick (Automated)  Result Value Ref Range   Color, UA Yellow    Clarity, UA Hazy    Glucose, UA Negative    Bilirubin, UA Negative    Ketones, UA Negative    Spec Grav, UA  1.020    Blood, UA Negative    pH, UA 6.0    Protein, UA 15 mg/dL    Urobilinogen, UA 0.2    Nitrite, UA Negative    Leukocytes, UA small (1+) (A) Negative     Patient Active Problem List   Diagnosis Date Noted  . Thoracic back pain 03/20/2016  . Abnormal urinalysis 03/20/2016  . Allergic rhinitis, seasonal 03/20/2016  . B12 deficiency 04/30/2015  . Mild cognitive impairment 01/29/2015  . Memory difficulties 06/27/2013  . Loss of weight 06/27/2013  . Stress reaction 06/27/2013  . Other screening mammogram 10/12/2010  . Osteoarthritis 10/12/2010  . Preventative health care 10/12/2010  . Low back pain 09/29/2009  . ABNORMAL FINDINGS GI TRACT 12/10/2008  . HYPERCHOLESTEROLEMIA, PURE 02/01/2007  . Essential hypertension 02/01/2007  . ESOPHAGEAL STRICTURE 01/22/2007  . IBS 01/22/2007  . Lumbar disc disease 01/22/2007   Past Medical History:  Diagnosis Date  . Diverticulosis of colon   . Hyperlipidemia   . Hypertension   . IBS (irritable bowel syndrome)   . Osteoarthritis   . Urinary incontinence    Past Surgical History:  Procedure Laterality Date  . ABDOMINAL HYSTERECTOMY    . APPENDECTOMY     with infection (surgery x2)  . BLADDER SURGERY  bladder tack  . BREAST CYST EXCISION  08/2002   breast discharge, cyst removed  . BREAST SURGERY     breast cyst needle biopsy  . FOOT NEUROMA SURGERY    . SHOULDER SURGERY     spur  . TONSILLECTOMY     Social History  Substance Use Topics  . Smoking status: Former Games developermoker  . Smokeless tobacco: Never Used  . Alcohol use No   Family History  Problem Relation Age of Onset  . Cancer Mother     kidney cancer  . Heart disease Father     MI  . Cancer Sister     lung  . Heart disease Sister     MI   Allergies  Allergen Reactions  . Amlodipine Besylate     REACTION: swelling  . Aricept [Donepezil Hcl] Diarrhea  . Atorvastatin     REACTION: unspecified  . Azithromycin     REACTION: nausea and vomiting  .  Clarithromycin     REACTION: severe diarrhea  . Ibuprofen     REACTION: unspecified  . Levofloxacin     REACTION: diarrhea  . Piroxicam     REACTION: unspecified  . Simvastatin     Cramps in her feet  . Metoprolol     Asymptomatic but severe bradycardia   Current Outpatient Prescriptions on File Prior to Visit  Medication Sig Dispense Refill  . levothyroxine (SYNTHROID, LEVOTHROID) 50 MCG tablet Take 1 tablet (50 mcg total) by mouth daily. 30 tablet 3  . omeprazole (PRILOSEC) 20 MG capsule TAKE ONE CAPSULE BY MOUTH ONCE DAILY 30 capsule 3  . traMADol (ULTRAM) 50 MG tablet TAKE ONE TO TWO TABLETS BY MOUTH EVERY 8 HOURS AS NEEDED 60 tablet 0  . valsartan-hydrochlorothiazide (DIOVAN-HCT) 160-12.5 MG tablet TAKE ONE TABLET BY MOUTH ONCE DAILY 90 tablet 3  . vitamin B-12 (CYANOCOBALAMIN) 1000 MCG tablet Take 1,000 mcg by mouth daily.     No current facility-administered medications on file prior to visit.     Review of Systems Review of Systems  Constitutional: Negative for fever, appetite change, fatigue and unexpected weight change.  Eyes: Negative for pain and visual disturbance.  Respiratory: Negative for cough and shortness of breath.   Cardiovascular: Negative for cp or palpitations    Gastrointestinal: Negative for nausea, diarrhea and constipation.  Genitourinary: Negative for urgency and frequency. neg for dysuria  Skin: Negative for pallor or rash   MSK pos for mid to low back pain w/o radiation  Neurological: Negative for weakness, light-headedness, numbness and headaches.  Hematological: Negative for adenopathy. Does not bruise/bleed easily.  Psychiatric/Behavioral: Negative for dysphoric mood. The patient is not nervous/anxious. Pos for poor memory         Objective:   Physical Exam  Constitutional: She appears well-developed and well-nourished. No distress.  Well appearing elderly female who confuses easily  HENT:  Head: Normocephalic and atraumatic.  Eyes:  Conjunctivae and EOM are normal. Pupils are equal, round, and reactive to light. No scleral icterus.  Neck: Normal range of motion. Neck supple.  Cardiovascular: Normal rate and regular rhythm.   Pulmonary/Chest: Effort normal and breath sounds normal. She has no wheezes. She has no rales.  Abdominal: Soft. Bowel sounds are normal. She exhibits no distension. There is no tenderness.  No suprapubic tenderness or fullness   No cva tenderness   Musculoskeletal: She exhibits tenderness.       Right shoulder: She exhibits decreased range of motion, tenderness and spasm. She exhibits  no bony tenderness, no swelling, no effusion, no crepitus, no deformity, normal pulse and normal strength.       Lumbar back: She exhibits decreased range of motion, tenderness and spasm. She exhibits no bony tenderness and no edema.  Neg SLR Nl neuro exam of LEs  Nl rom hips and knees  Back pain worst on flex over 90 deg and L flex  Nl gait  Baseline thoracolumbar scoliosis    Lymphadenopathy:    She has no cervical adenopathy.  Neurological: She is alert. She has normal strength and normal reflexes. She displays no atrophy. No cranial nerve deficit or sensory deficit. She exhibits normal muscle tone. Coordination normal.  Negative SLR  Skin: Skin is warm and dry. No rash noted. No erythema. No pallor.  Psychiatric: She has a normal mood and affect. Cognition and memory are impaired. She exhibits abnormal recent memory.  Pleasant  Confuses easily-occ repeats herself           Assessment & Plan:   Problem List Items Addressed This Visit      Other   Low back pain - Primary    Ongoing and unchanged  Did sent urine for a culture Decent rom  Hx of scoliosis and deg change and comp def  Re assuring exam Disc use of heat for 10 min at a time  Disc back stretches - and given a handout with stretches/exercises (she declines PT) She has tramadol if needed for pm use      Relevant Orders   POCT  Urinalysis Dipstick (Automated) (Completed)   Abnormal urinalysis    sm leuk on ua  No urinary symptoms except back pain  Enc water intake  cx pending       Relevant Orders   Urine culture

## 2016-06-19 NOTE — Assessment & Plan Note (Signed)
Ongoing and unchanged  Did sent urine for a culture Decent rom  Hx of scoliosis and deg change and comp def  Re assuring exam Disc use of heat for 10 min at a time  Disc back stretches - and given a handout with stretches/exercises (she declines PT) She has tramadol if needed for pm use

## 2016-06-19 NOTE — Assessment & Plan Note (Signed)
sm leuk on ua  No urinary symptoms except back pain  Enc water intake  cx pending

## 2016-06-19 NOTE — Progress Notes (Signed)
Pre visit review using our clinic review tool, if applicable. No additional management support is needed unless otherwise documented below in the visit note. 

## 2016-06-19 NOTE — Patient Instructions (Addendum)
We will send for your last mammogram - I am aware you do not want to continue these  I think you back pain is from your chronic arthritis and old compression deformity and scoliosis  I'm glad it is not worse than it was  Take a look at the handout with back stretches/exercises We will send your urine for a culture to make sure you do not have a uti  We will have you sign a paper saying we share medical information with your daughter if needed -this is helpful due to your memory issues

## 2016-06-21 ENCOUNTER — Encounter: Payer: Self-pay | Admitting: *Deleted

## 2016-06-21 LAB — URINE CULTURE: ORGANISM ID, BACTERIA: NO GROWTH

## 2016-08-21 ENCOUNTER — Telehealth: Payer: Self-pay

## 2016-08-21 NOTE — Telephone Encounter (Signed)
It sounds like her cognitive status is likely worsening re: the comments about family/etc Her daughter has DPR status - if you can get through to her (or send letter)- I would encourage her to come with Rachel Knox for her next visit  If her symptoms do continue-follow up  If she thinks she needs renal imaging- we need to discuss it and MRI is not generally the first choice Her last urine cx was negative   Thanks

## 2016-08-21 NOTE — Telephone Encounter (Signed)
Pt notified of Dr. Royden Purlower's comments. Pt refuses to go to Ortho she said she thinks she has a UTI. I advised pt if she thinks she has a UTI then she needs to be seen, I offered to scheduled an appt for pt to be eval for UTI. Pt refused and said the only thing "they recommended" was for her to get a MRI of her kidneys. I asked pt who "they" were that recommended this and she advise me it was her parents and family. I explained to pt that she would need an appt with Dr. Milinda Antisower so we could check her for a UTI and she could discuss getting imaging at the appt. Pt refused again and said if Dr. Milinda Antisower isn't going to do a MRI scan of her kidneys then she will "ride it out" and see if her sxs resolve by themselves. I encouraged pt to call office back to schedule an appt when she is ready

## 2016-08-21 NOTE — Telephone Encounter (Signed)
I would rather ref her to orthopedics- since she did not have any neurologic symptoms when I saw her (such as numbness/weakness/radiation of pain to legs)  I'm not sure an MRI would tell us much more  Would she prefer [place] or [place]?

## 2016-08-21 NOTE — Telephone Encounter (Signed)
Pt was last seen 06/19/16(noted at that visit back pain might be due to old compression deformity,scoliosis or chronic arthritis; pt continues with back pain as when seen 06/19/16; no new injury but back continues to hurt and pt request CT or MRI of back to find out what is causing back pain. Pt request cb.

## 2016-09-01 NOTE — Telephone Encounter (Signed)
Pt has an appt scheduled next week wouldn't come in any earlier

## 2016-09-08 ENCOUNTER — Ambulatory Visit: Payer: Medicare Other | Admitting: Family Medicine

## 2016-10-06 ENCOUNTER — Other Ambulatory Visit: Payer: Self-pay | Admitting: Internal Medicine

## 2016-10-06 NOTE — Telephone Encounter (Signed)
Last filled 01/2016--please advise

## 2016-10-07 NOTE — Telephone Encounter (Signed)
Px written for call in   

## 2016-10-09 NOTE — Telephone Encounter (Signed)
Rx called in as prescribed 

## 2016-10-16 ENCOUNTER — Ambulatory Visit: Payer: Medicare Other

## 2017-01-05 ENCOUNTER — Ambulatory Visit (INDEPENDENT_AMBULATORY_CARE_PROVIDER_SITE_OTHER): Payer: Medicare Other | Admitting: Primary Care

## 2017-01-05 ENCOUNTER — Encounter: Payer: Self-pay | Admitting: Primary Care

## 2017-01-05 ENCOUNTER — Telehealth: Payer: Self-pay

## 2017-01-05 DIAGNOSIS — G3184 Mild cognitive impairment, so stated: Secondary | ICD-10-CM | POA: Diagnosis not present

## 2017-01-05 DIAGNOSIS — E7800 Pure hypercholesterolemia, unspecified: Secondary | ICD-10-CM

## 2017-01-05 DIAGNOSIS — I10 Essential (primary) hypertension: Secondary | ICD-10-CM

## 2017-01-05 DIAGNOSIS — M519 Unspecified thoracic, thoracolumbar and lumbosacral intervertebral disc disorder: Secondary | ICD-10-CM

## 2017-01-05 DIAGNOSIS — E039 Hypothyroidism, unspecified: Secondary | ICD-10-CM

## 2017-01-05 NOTE — Assessment & Plan Note (Signed)
A few repetitive statements today, overall alert and oriented.

## 2017-01-05 NOTE — Progress Notes (Signed)
Subjective:    Patient ID: Rachel Knox, female    DOB: 29-Jul-1927, 83 y.o.   MRN: [id number]  HPI  Rachel Knox is an 83 year old female who presents today wanting a "check-up". She is wanting to make sure her blood pressure is stable and her heart sounds good. She has no complaints. She is not fasting today.  1) Essential Hypertension: Currently managed on valsartan-HCTZ 160/12.5 mg. Her BP in the office today is 118/78. She denies blurred vision, dizziness, chest pain, lower extremity edema.  Diet currently consists of:  Breakfast: Fried egg, scrambled egg, toast, cheese Lunch: Sandwich Dinner: Meat, vegetables, beans Snacks: None Desserts: Daily Beverages: Orange juice, coffee, Dr. Reino KentPepper, Pepsi  Exercise: She walks 1-2 miles every evening.   2) Hypothyroidism: Currently managed on levothyroxine 50 mcg once daily. Her last TSH was normal in October 2017. She denies palpitations, fatigue.  3) Chronic Back Pain: History of thoracic and lumbar degenerative changes with mild scoliosis. Imaging completed in August 2017. She is managed on Tramadol for which she will take 1-2 times weekly on average with improvement.   4) Hyperlipidemia: Lipid panel above goal in October 2017. She is not fasting today. She is working on improvements in her diet and is walking 1-2 miles daily.   Review of Systems  Constitutional: Negative for fatigue.  Eyes: Negative for visual disturbance.  Respiratory: Negative for shortness of breath.   Cardiovascular: Negative for chest pain.  Musculoskeletal: Positive for arthralgias. Negative for back pain.  Neurological: Negative for dizziness, weakness and headaches.       Past Medical History:  Diagnosis Date  . Diverticulosis of colon   . Hyperlipidemia   . Hypertension   . IBS (irritable bowel syndrome)   . Osteoarthritis   . Urinary incontinence      Social History   Social History  . Marital status: Divorced    Spouse name: N/A  .  Number of children: N/A  . Years of education: N/A   Occupational History  . Not on file.   Social History Main Topics  . Smoking status: Former Games developermoker  . Smokeless tobacco: Never Used  . Alcohol use No  . Drug use: No  . Sexual activity: Not on file   Other Topics Concern  . Not on file   Social History Narrative  . No narrative on file    Past Surgical History:  Procedure Laterality Date  . ABDOMINAL HYSTERECTOMY    . APPENDECTOMY     with infection (surgery x2)  . BLADDER SURGERY     bladder tack  . BREAST CYST EXCISION  08/2002   breast discharge, cyst removed  . BREAST SURGERY     breast cyst needle biopsy  . FOOT NEUROMA SURGERY    . SHOULDER SURGERY     spur  . TONSILLECTOMY      Family History  Problem Relation Age of Onset  . Cancer Mother        kidney cancer  . Heart disease Father        MI  . Cancer Sister        lung  . Heart disease Sister        MI    Allergies  Allergen Reactions  . Amlodipine Besylate     REACTION: swelling  . Aricept [Donepezil Hcl] Diarrhea  . Atorvastatin     REACTION: unspecified  . Azithromycin     REACTION: nausea and vomiting  . Clarithromycin  REACTION: severe diarrhea  . Ibuprofen     REACTION: unspecified  . Levofloxacin     REACTION: diarrhea  . Piroxicam     REACTION: unspecified  . Simvastatin     Cramps in her feet  . Metoprolol     Asymptomatic but severe bradycardia    Current Outpatient Prescriptions on File Prior to Visit  Medication Sig Dispense Refill  . levothyroxine (SYNTHROID, LEVOTHROID) 50 MCG tablet Take 1 tablet (50 mcg total) by mouth daily. 30 tablet 3  . omeprazole (PRILOSEC) 20 MG capsule TAKE ONE CAPSULE BY MOUTH ONCE DAILY 30 capsule 3  . traMADol (ULTRAM) 50 MG tablet TAKE ONE TO TWO TABLETS BY MOUTH EVERY 8 HOURS AS NEEDED FOR PAIN 60 tablet 0  . valsartan-hydrochlorothiazide (DIOVAN-HCT) 160-12.5 MG tablet TAKE ONE TABLET BY MOUTH ONCE DAILY 90 tablet 3  . vitamin  B-12 (CYANOCOBALAMIN) 1000 MCG tablet Take 1,000 mcg by mouth daily.     No current facility-administered medications on file prior to visit.     BP 118/78   Pulse 62   Temp 98.4 F (36.9 C) (Oral)   Ht 5' 4.5" (1.638 m)   Wt 129 lb (58.5 kg)   LMP 07/24/1980   SpO2 96%   BMI 21.80 kg/m    Objective:   Physical Exam  Constitutional: She is oriented to person, place, and time. She appears well-nourished.  Neck: Neck supple.  Cardiovascular: Normal rate and regular rhythm.   Pulmonary/Chest: Effort normal and breath sounds normal.  Abdominal: Soft. Bowel sounds are normal. There is no tenderness.  Neurological: She is alert and oriented to person, place, and time.  Skin: Skin is warm and dry.  Psychiatric: She has a normal mood and affect.  Some repetitive statements, overall alert and oriented.          Assessment & Plan:

## 2017-01-05 NOTE — Telephone Encounter (Signed)
She denied any back pain during her visit today. The kidneys are located to the mid to upper back on both sides. Based off of her prior xrays it looks like she has arthritis and scoliosis which could cause the same symptoms. Her last kidney function was stable. If she's concerned then please have her see PCP for concerns.

## 2017-01-05 NOTE — Telephone Encounter (Signed)
Spoken and notified patient of Kate's comments. Patient verbalized understanding. 

## 2017-01-05 NOTE — Patient Instructions (Addendum)
Your heart and lungs sound great!  Your blood pressure looks great! Continue taking your valsartan-hydrochlorothiazide 160-12.5 mg tablets for blood pressure.  You are due for lab work this Fall. Please [place] follow up [place] Come fasting to this visit.  It was a pleasure meeting you!

## 2017-01-05 NOTE — Assessment & Plan Note (Signed)
No acute pain now. Using Tramadol sparingly.

## 2017-01-05 NOTE — Assessment & Plan Note (Signed)
Lipids from October 2017 above goal. Commended her on her exercise and improvements in diet. Recommend we repeat these soon, but she is not fasting. She will schedule follow up with PCP to have these rechecked.

## 2017-01-05 NOTE — Assessment & Plan Note (Signed)
Stable on today's visit. Continue Diovan. BMP on file from October 2017 seems stable. Repeat this Fall.

## 2017-01-05 NOTE — Telephone Encounter (Signed)
Pt left v/m; pt was seen earlier today and pt forgot to ask for xray of her kidneys. Pt has lower back pain on both sides of back on and off for 2 months; pain level now is 5. No UTI symptoms. Pt request cb with appt.

## 2017-01-05 NOTE — Assessment & Plan Note (Signed)
Compliant to levothyroxine 50 mcg. TSH in October 2017 stable. Recheck this October.

## 2017-01-27 ENCOUNTER — Other Ambulatory Visit: Payer: Self-pay | Admitting: Family Medicine

## 2017-01-29 NOTE — Telephone Encounter (Signed)
Px written for call in   She saw Jae DireKate in June who wanted her to f/u with me in the fall  Please schedule that if she is ready

## 2017-01-29 NOTE — Telephone Encounter (Signed)
Rx called in as prescribed and f/u appt scheduled with pt 

## 2017-01-29 NOTE — Telephone Encounter (Signed)
Pt cancel her CPE/AWV for this year, pt has had a few acute appts, last filled on 10/07/16 #60 tabs with 0 refills, please advise

## 2017-02-07 ENCOUNTER — Telehealth: Payer: Self-pay | Admitting: Family Medicine

## 2017-02-07 DIAGNOSIS — N3946 Mixed incontinence: Secondary | ICD-10-CM | POA: Insufficient documentation

## 2017-02-07 DIAGNOSIS — G8929 Other chronic pain: Secondary | ICD-10-CM

## 2017-02-07 DIAGNOSIS — M545 Low back pain: Principal | ICD-10-CM

## 2017-02-07 NOTE — Telephone Encounter (Signed)
Ref done  [facility]  Due to memory problems may also want to talk to her daughter   (DPR signed per her chart)

## 2017-02-07 NOTE — Telephone Encounter (Signed)
Pt called - she would like to have referral [facility]  She states she has discussed this with you already  cb number is [phone] Thanks

## 2017-02-09 NOTE — Telephone Encounter (Signed)
Aware, thanks!

## 2017-02-09 NOTE — Telephone Encounter (Signed)
Spoke with patient and daughter and now the patient doesn't want to see Urology at this time. I told daughter to call me if she changes her mind. I will cancel this Referral.

## 2017-04-17 ENCOUNTER — Ambulatory Visit: Payer: Medicare Other | Admitting: Family Medicine

## 2017-04-27 ENCOUNTER — Other Ambulatory Visit: Payer: Self-pay | Admitting: Family Medicine

## 2017-04-30 ENCOUNTER — Other Ambulatory Visit: Payer: Self-pay

## 2017-04-30 NOTE — Patient Outreach (Signed)
[facility] Customer service manager [facility]) Care Management  04/30/2017  Rachel Knox 12/14/1927 [id number]   Medication Adherence call to [name] the reason for this call is because [name] is showing past due under [facility] Ins.on her Valsartan/hctz 160/12.5 spoke to patient she said she does not need any medication at this time she still has some medication and she will order when she is finished with what she had.   [name] CPhT Pharmacy Technician [facility] Care Management Direct Dial [phone]  Fax [phone] [name]

## 2017-05-17 ENCOUNTER — Encounter: Payer: Self-pay | Admitting: Family Medicine

## 2017-05-17 ENCOUNTER — Ambulatory Visit: Payer: Medicare Other | Admitting: Internal Medicine

## 2017-05-17 ENCOUNTER — Ambulatory Visit (INDEPENDENT_AMBULATORY_CARE_PROVIDER_SITE_OTHER): Payer: Medicare Other | Admitting: Family Medicine

## 2017-05-17 VITALS — BP 162/72 | HR 48 | Temp 97.4°F | Ht 64.5 in | Wt 126.0 lb

## 2017-05-17 DIAGNOSIS — I10 Essential (primary) hypertension: Secondary | ICD-10-CM | POA: Diagnosis not present

## 2017-05-17 DIAGNOSIS — R413 Other amnesia: Secondary | ICD-10-CM | POA: Diagnosis not present

## 2017-05-17 DIAGNOSIS — Z23 Encounter for immunization: Secondary | ICD-10-CM | POA: Diagnosis not present

## 2017-05-17 NOTE — Assessment & Plan Note (Signed)
Discussed memory issues and HTN with pt and her sister. Since she was normotensive at last OV and has not taken her medication today, I advised she go home and take it and then follow up with Dr. Milinda Antisower for both memory (has been intolerant to aricept) and HTN. The patient indicates understanding of these issues and agrees with the plan.

## 2017-05-17 NOTE — Patient Instructions (Signed)
Great to meet you, Ms. Rachel Knox.  When you get home, please take your blood pressure medication.  Make an appointment to see Dr. Milinda Antisower on your way out.

## 2017-05-17 NOTE — Progress Notes (Signed)
Subjective:   Patient ID: Rachel Knox, female    DOB: 22-Apr-1928, 83 y.o.   MRN: [id number]  CARTER KAMAN is a pleasant 83 y.o. year old female who presents to clinic today with Hypertension (Patient is here today to follow-up with HTN.  She is unsure if she took her BP med this am. ) and Memory Loss (Patient also asks "Can I get something for my memory? I have a problem with remembering.")  on 05/17/2017  HPI:   Pt of Dr. Royden Purl, new to me.  She is here with her sister today.  Essential Hypertension: Currently managed on valsartan-HCTZ 160/12.5 mg. Her BP in the office today is 162/72. She denies blurred vision, dizziness, chest pain, lower extremity edema.   She cannot remember if took her blood pressure medication this morning.  She does not think that she has.  Her sister is concerned about her memory. Chart reviewed- appears she was on aricept in the past but could not tolerate this.  Current Outpatient Prescriptions on File Prior to Visit  Medication Sig Dispense Refill  . traMADol (ULTRAM) 50 MG tablet TAKE 1 TO 2 TABLETS BY MOUTH EVERY 8 HOURS AS NEEDED FOR PAIN 60 tablet 0  . valsartan-hydrochlorothiazide (DIOVAN-HCT) 160-12.5 MG tablet TAKE ONE TABLET BY MOUTH ONCE DAILY 90 tablet 3  . levothyroxine (SYNTHROID, LEVOTHROID) 50 MCG tablet Take 1 tablet (50 mcg total) by mouth daily. (Patient not taking: Reported on 05/17/2017) 30 tablet 3   No current facility-administered medications on file prior to visit.     Allergies  Allergen Reactions  . Amlodipine Besylate     REACTION: swelling  . Aricept [Donepezil Hcl] Diarrhea  . Atorvastatin     REACTION: unspecified  . Azithromycin     REACTION: nausea and vomiting  . Clarithromycin     REACTION: severe diarrhea  . Ibuprofen     REACTION: unspecified  . Levofloxacin     REACTION: diarrhea  . Piroxicam     REACTION: unspecified  . Simvastatin     Cramps in her feet  . Metoprolol     Asymptomatic but  severe bradycardia    Past Medical History:  Diagnosis Date  . Diverticulosis of colon   . Hyperlipidemia   . Hypertension   . IBS (irritable bowel syndrome)   . Osteoarthritis   . Urinary incontinence     Past Surgical History:  Procedure Laterality Date  . ABDOMINAL HYSTERECTOMY    . APPENDECTOMY     with infection (surgery x2)  . BLADDER SURGERY     bladder tack  . BREAST CYST EXCISION  08/2002   breast discharge, cyst removed  . BREAST SURGERY     breast cyst needle biopsy  . FOOT NEUROMA SURGERY    . SHOULDER SURGERY     spur  . TONSILLECTOMY      Family History  Problem Relation Age of Onset  . Cancer Mother        kidney cancer  . Heart disease Father        MI  . Cancer Sister        lung  . Heart disease Sister        MI    Social History   Social History  . Marital status: Divorced    Spouse name: N/A  . Number of children: N/A  . Years of education: N/A   Occupational History  . Not on file.   Social History Main Topics  .  Smoking status: Former Games developermoker  . Smokeless tobacco: Never Used  . Alcohol use No  . Drug use: No  . Sexual activity: Not on file   Other Topics Concern  . Not on file   Social History Narrative  . No narrative on file   The PMH, PSH, Social History, Family History, Medications, and allergies [facility], and have been updated if relevant.    Review of Systems  Constitutional: Negative.   Eyes: Negative.   Respiratory: Negative.   Cardiovascular: Negative.   Psychiatric/Behavioral: Positive for confusion. Negative for dysphoric mood, hallucinations and sleep disturbance. The patient is not nervous/anxious and is not hyperactive.   All other systems reviewed and are negative.      Objective:    BP (!) 162/72 (BP Location: Right Arm, Patient Position: Sitting, Cuff Size: Normal)   Pulse (!) 48   Temp (!) 97.4 F (36.3 C) (Oral)   Ht 5' 4.5" (1.638 m)   Wt 126 lb (57.2 kg)   LMP 07/24/1980    SpO2 97%   BMI 21.29 kg/m    Physical Exam  Constitutional: She appears well-developed and well-nourished. No distress.  HENT:  Head: Normocephalic and atraumatic.  Eyes: Conjunctivae are normal.  Cardiovascular: Normal rate.   Pulmonary/Chest: Effort normal.  Musculoskeletal: Normal range of motion. She exhibits no edema.  Neurological: She is alert.  Skin: Skin is warm and dry. She is not diaphoretic.  Psychiatric: She has a normal mood and affect. Her behavior is normal. Judgment and thought content normal.  Nursing note and vitals reviewed.         Assessment & Plan:   Essential hypertension  Need for influenza vaccination - Plan: Flu Vaccine QUAD 6+ mos PF IM (Fluarix Quad PF)  Memory difficulties No Follow-up on file.

## 2017-05-22 ENCOUNTER — Ambulatory Visit: Payer: Medicare Other | Admitting: Family Medicine

## 2017-05-25 ENCOUNTER — Telehealth: Payer: Self-pay | Admitting: Family Medicine

## 2017-05-25 MED ORDER — LOSARTAN POTASSIUM-HCTZ 100-12.5 MG PO TABS: 1.0000 | ORAL_TABLET | Freq: Every day | ORAL | 1 refills | Status: DC

## 2017-05-25 NOTE — Telephone Encounter (Signed)
Rx refill

## 2017-05-25 NOTE — Telephone Encounter (Signed)
Pt notified of recall of Diovan and advise to start the losartan-HCTZ, and to update us of any issues, pt verbalized understanding

## 2017-05-25 NOTE — Telephone Encounter (Signed)
Please let her know that valsartan is not avail at this time  We will change to losartan hct (is is in the same class)  If any problems let me know  I will send that in Take one daily

## 2017-05-25 NOTE — Telephone Encounter (Signed)
See prev note, only BP med on med list is the Diovan but there was a recall on this med months ago, please advise

## 2017-05-25 NOTE — Telephone Encounter (Signed)
Copied from CRM #[id number]. Topic: Quick Communication - See Telephone Encounter >> May 25, 2017  8:12 AM Landry MellowFoltz, Melissa J wrote: CRM for notification. See Telephone encounter for:  05/25/17. Pt is out of blood pressure medication. She lost it and has been without for 5 days. [facility] [address]

## 2017-06-08 ENCOUNTER — Ambulatory Visit: Payer: Medicare Other | Admitting: Primary Care

## 2017-08-12 ENCOUNTER — Other Ambulatory Visit: Payer: Self-pay | Admitting: Family Medicine

## 2017-08-13 NOTE — Telephone Encounter (Signed)
Aware- we cannot refill medications any further until pt agrees to follow up  Thanks

## 2017-08-13 NOTE — Telephone Encounter (Signed)
Pt had an acute appt with another provider on 05/17/17, but no recent f/u or CPE, last filled on 01/29/17 #60 tabs with 0 refills, please advise

## 2017-08-13 NOTE — Telephone Encounter (Signed)
Refilled electronically   Please schedule a f/u with me when able to check in when able  She has some mild memory loss- unsure if she will want to bring someone with her  Thanks

## 2017-08-13 NOTE — Telephone Encounter (Signed)
Spoke with pt (daughter was there but wanted me to speak with pt not her), pt declined to schedule f/u. Pt said she doesn't want to come to the doctor office. I advise pt we need to see her once yearly to refill her meds including her synthroid and hyzaar and pt said she's still not scheduling any appts but if she changes her mind she will call back and schedule an appt

## 2017-08-25 ENCOUNTER — Other Ambulatory Visit: Payer: Self-pay | Admitting: Family Medicine

## 2017-08-27 ENCOUNTER — Encounter: Payer: Self-pay | Admitting: *Deleted

## 2017-08-27 NOTE — Telephone Encounter (Signed)
Pt stated on the phone last month that she was not planning to come back to the office  I think she is having some dementia issues  If you could talk to her daughter that would be more helpful  If she is going to change practices we can refill a month to get est with someone else   Thanks

## 2017-08-27 NOTE — Telephone Encounter (Signed)
Spoke with pt who declined for me to speak with her daughter she said she wants to handle this herself. appt scheduled with pt and I will mail a letter advising her of her appt time so her daughter is aware too

## 2017-08-27 NOTE — Telephone Encounter (Signed)
p had a few acute appts with other providers but hasn't seen Dr. Milinda Antisower since 05/2016 and no future appts, please advise

## 2017-09-10 ENCOUNTER — Ambulatory Visit (INDEPENDENT_AMBULATORY_CARE_PROVIDER_SITE_OTHER): Payer: Medicare Other | Admitting: Family Medicine

## 2017-09-10 ENCOUNTER — Encounter: Payer: Self-pay | Admitting: Family Medicine

## 2017-09-10 VITALS — BP 135/70 | HR 50 | Temp 97.4°F | Ht 64.0 in | Wt 134.5 lb

## 2017-09-10 DIAGNOSIS — E039 Hypothyroidism, unspecified: Secondary | ICD-10-CM

## 2017-09-10 DIAGNOSIS — E7800 Pure hypercholesterolemia, unspecified: Secondary | ICD-10-CM | POA: Diagnosis not present

## 2017-09-10 DIAGNOSIS — F0390 Unspecified dementia without behavioral disturbance: Secondary | ICD-10-CM | POA: Diagnosis not present

## 2017-09-10 DIAGNOSIS — I10 Essential (primary) hypertension: Secondary | ICD-10-CM | POA: Diagnosis not present

## 2017-09-10 DIAGNOSIS — E538 Deficiency of other specified B group vitamins: Secondary | ICD-10-CM | POA: Diagnosis not present

## 2017-09-10 LAB — LIPID PANEL
CHOLESTEROL: 240 mg/dL — AB (ref 0–200)
HDL: 37.6 mg/dL — ABNORMAL LOW (ref >39.00)
LDL CALC: 167 mg/dL — AB (ref 0–99)
NonHDL: 202
TRIGLYCERIDES: 176 mg/dL — AB (ref 0.0–149.0)
Total CHOL/HDL Ratio: 6
VLDL: 35.2 mg/dL (ref 0.0–40.0)

## 2017-09-10 LAB — CBC WITH DIFFERENTIAL/PLATELET
BASOS PCT: 0.3 % (ref 0.0–3.0)
Basophils Absolute: 0 10*3/uL (ref 0.0–0.1)
EOS PCT: 1.4 % (ref 0.0–5.0)
Eosinophils Absolute: 0.1 10*3/uL (ref 0.0–0.7)
HCT: 37 % (ref 36.0–46.0)
HEMOGLOBIN: 12.4 g/dL (ref 12.0–15.0)
LYMPHS PCT: 43.4 % (ref 12.0–46.0)
Lymphs Abs: 2.3 10*3/uL (ref 0.7–4.0)
MCHC: 33.5 g/dL (ref 30.0–36.0)
MCV: 97.2 fl (ref 78.0–100.0)
MONOS PCT: 7.1 % (ref 3.0–12.0)
Monocytes Absolute: 0.4 10*3/uL (ref 0.1–1.0)
Neutro Abs: 2.5 10*3/uL (ref 1.4–7.7)
Neutrophils Relative %: 47.8 % (ref 43.0–77.0)
Platelets: 222 10*3/uL (ref 150.0–400.0)
RBC: 3.81 Mil/uL — ABNORMAL LOW (ref 3.87–5.11)
RDW: 14.2 % (ref 11.5–15.5)
WBC: 5.3 10*3/uL (ref 4.0–10.5)

## 2017-09-10 LAB — COMPREHENSIVE METABOLIC PANEL
ALBUMIN: 3.8 g/dL (ref 3.5–5.2)
ALT: 11 U/L (ref 0–35)
AST: 16 U/L (ref 0–37)
Alkaline Phosphatase: 65 U/L (ref 39–117)
BUN: 25 mg/dL — ABNORMAL HIGH (ref 6–23)
CHLORIDE: 102 meq/L (ref 96–112)
CO2: 27 mEq/L (ref 19–32)
Calcium: 9.2 mg/dL (ref 8.4–10.5)
Creatinine, Ser: 1.26 mg/dL — ABNORMAL HIGH (ref 0.40–1.20)
GFR: 42.59 mL/min — ABNORMAL LOW (ref >60.00)
Glucose, Bld: 86 mg/dL (ref 70–99)
POTASSIUM: 4.5 meq/L (ref 3.5–5.1)
Sodium: 138 mEq/L (ref 135–145)
Total Bilirubin: 0.3 mg/dL (ref 0.2–1.2)
Total Protein: 6.6 g/dL (ref 6.0–8.3)

## 2017-09-10 LAB — TSH: TSH: 7.46 u[IU]/mL — ABNORMAL HIGH (ref 0.35–4.50)

## 2017-09-10 LAB — VITAMIN B12: VITAMIN B 12: 109 pg/mL — AB (ref 211–911)

## 2017-09-10 MED ORDER — LEVOTHYROXINE SODIUM 50 MCG PO TABS: 50.0000 ug | ORAL_TABLET | Freq: Every day | ORAL | 3 refills | Status: AC

## 2017-09-10 MED ORDER — LOSARTAN POTASSIUM-HCTZ 100-12.5 MG PO TABS: 1.0000 | ORAL_TABLET | Freq: Every day | ORAL | 3 refills | Status: AC

## 2017-09-10 NOTE — Progress Notes (Signed)
Subjective:    Patient ID: Rachel Knox, female    DOB: 02-12-1928, 84 y.o.   MRN: [id number]  HPI Here for f/u of chronic medical problems   Wt Readings from Last 3 Encounters:  09/10/17 134 lb 8 oz (61 kg)  05/17/17 126 lb (57.2 kg)  01/05/17 129 lb (58.5 kg)   23.09 kg/m   She is not working right now (thinks she is and has dementia)  She has chronic back pain- deals with it day to day  Has to be careful about lifting    Continues to have memory problems but denies any problems  At times will allow [place] to speak to her daughter when we call -which is difficult   She declines all health mt  Did get a flu shot this year   bp is stable today  Has not taken her medicine yet- since she has not eaten breakfast  No cp or palpitations or headaches or edema  No side effects to medicines  BP Readings from Last 3 Encounters:  09/10/17 140/68  05/17/17 (!) 162/72  01/05/17 118/78     Takes losartan hct -no problems   Lab Results  Component Value Date   CREATININE 1.23 (H) 05/16/2016   BUN 18 05/16/2016   NA 141 05/16/2016   K 4.3 05/16/2016   CL 103 05/16/2016   CO2 30 05/16/2016  does not drink water  Per daughter drinks a lot of soda   Due for labs   Hypothyroidism  Pt has no clinical changes  (feels fine)  No change in energy level/ hair or skin/ edema and no tremor Lab Results  Component Value Date   TSH 3.79 05/16/2016      Mood is pretty good   Memory loss- does not think she has lost any more memory (denies problem) No falls or accidents  Still drives -just to familiar places  Prev MCI  Intol to aricept and declined other tx   B12 def Lab Results  Component Value Date   VITAMINB12 945 (H) 07/02/2015  not taking any B12 right now    Hx of hyperlipidemia Lab Results  Component Value Date   CHOL 291 (H) 05/16/2016   HDL 41.30 05/16/2016   LDLCALC 212 (H) 05/16/2016   LDLDIRECT 192.0 03/20/2016   TRIG 191.0 (H) 05/16/2016   CHOLHDL 7  05/16/2016  intolerant of statins  occ red meat -no more that 2-3 times per month   No fried foods No greasy foods   Patient Active Problem List   Diagnosis Date Noted  . Mixed incontinence 02/07/2017  . Hypothyroidism 01/05/2017  . Thoracic back pain 03/20/2016  . Abnormal urinalysis 03/20/2016  . Allergic rhinitis, seasonal 03/20/2016  . B12 deficiency 04/30/2015  . Dementia 01/29/2015  . Memory difficulties 06/27/2013  . Loss of weight 06/27/2013  . Stress reaction 06/27/2013  . Other screening mammogram 10/12/2010  . Osteoarthritis 10/12/2010  . Preventative health care 10/12/2010  . ABNORMAL FINDINGS GI TRACT 12/10/2008  . HYPERCHOLESTEROLEMIA, PURE 02/01/2007  . Essential hypertension 02/01/2007  . ESOPHAGEAL STRICTURE 01/22/2007  . IBS 01/22/2007  . Lumbar disc disease 01/22/2007   Past Medical History:  Diagnosis Date  . Diverticulosis of colon   . Hyperlipidemia   . Hypertension   . IBS (irritable bowel syndrome)   . Osteoarthritis   . Urinary incontinence    Past Surgical History:  Procedure Laterality Date  . ABDOMINAL HYSTERECTOMY    . APPENDECTOMY  with infection (surgery x2)  . BLADDER SURGERY     bladder tack  . BREAST CYST EXCISION  08/2002   breast discharge, cyst removed  . BREAST SURGERY     breast cyst needle biopsy  . FOOT NEUROMA SURGERY    . SHOULDER SURGERY     spur  . TONSILLECTOMY     Social History   Tobacco Use  . Smoking status: Former Games developermoker  . Smokeless tobacco: Never Used  Substance Use Topics  . Alcohol use: No    Alcohol/week: 0.0 oz  . Drug use: No   Family History  Problem Relation Age of Onset  . Cancer Mother        kidney cancer  . Heart disease Father        MI  . Cancer Sister        lung  . Heart disease Sister        MI   Allergies  Allergen Reactions  . Amlodipine Besylate     REACTION: swelling  . Aricept [Donepezil Hcl] Diarrhea  . Atorvastatin     REACTION: unspecified  . Azithromycin       REACTION: nausea and vomiting  . Clarithromycin     REACTION: severe diarrhea  . Ibuprofen     REACTION: unspecified  . Levofloxacin     REACTION: diarrhea  . Piroxicam     REACTION: unspecified  . Simvastatin     Cramps in her feet  . Metoprolol     Asymptomatic but severe bradycardia   Current Outpatient Medications on File Prior to Visit  Medication Sig Dispense Refill  . traMADol (ULTRAM) 50 MG tablet TAKE 1 TO 2 TABLETS BY MOUTH EVERY 8 HOURS AS NEEDED FOR PAIN 60 tablet 0   No current facility-administered medications on file prior to visit.     Review of Systems  Constitutional: Negative for activity change, appetite change, fatigue, fever and unexpected weight change.  HENT: Negative for congestion, ear pain, rhinorrhea, sinus pressure and sore throat.   Eyes: Negative for pain, redness and visual disturbance.  Respiratory: Negative for cough, shortness of breath and wheezing.   Cardiovascular: Negative for chest pain and palpitations.  Gastrointestinal: Negative for abdominal pain, blood in stool, constipation and diarrhea.  Endocrine: Negative for polydipsia and polyuria.  Genitourinary: Negative for dysuria, frequency and urgency.  Musculoskeletal: Negative for arthralgias, back pain and myalgias.  Skin: Negative for pallor and rash.  Allergic/Immunologic: Negative for environmental allergies.  Neurological: Negative for dizziness, syncope and headaches.  Hematological: Negative for adenopathy. Does not bruise/bleed easily.  Psychiatric/Behavioral: Positive for agitation and confusion. Negative for decreased concentration and dysphoric mood. The patient is not nervous/anxious.        Per pt's daughter- dementia with poor short term memory /some paranoia /occ confusion        Objective:   Physical Exam  Constitutional: She appears well-developed and well-nourished. No distress.  Well appearing elderly female with dementia   HENT:  Head: Normocephalic and  atraumatic.  Right Ear: External ear normal.  Left Ear: External ear normal.  Nose: Nose normal.  Mouth/Throat: Oropharynx is clear and moist.  Eyes: Conjunctivae and EOM are normal. Pupils are equal, round, and reactive to light. Right eye exhibits no discharge. Left eye exhibits no discharge. No scleral icterus.  Neck: Normal range of motion. Neck supple. No JVD present. Carotid bruit is not present. No thyromegaly present.  Cardiovascular: Normal rate, regular rhythm, normal heart sounds and intact  distal pulses. Exam reveals no gallop.  Pulmonary/Chest: Effort normal and breath sounds normal. No respiratory distress. She has no wheezes. She has no rales.  Abdominal: Soft. Bowel sounds are normal. She exhibits no distension and no mass. There is no tenderness.  Musculoskeletal: She exhibits no edema or tenderness.  Kyphosis noted   Lymphadenopathy:    She has no cervical adenopathy.  Neurological: She is alert. She has normal reflexes. No cranial nerve deficit. She exhibits normal muscle tone. Coordination normal.  Skin: Skin is warm and dry. No rash noted. No erythema. No pallor.  Nl skin color and turgor   Psychiatric: She has a normal mood and affect. Her behavior is normal. Her mood appears not anxious. Her affect is not blunt and not labile. Her speech is tangential. Thought content is paranoid. Cognition and memory are impaired. She does not exhibit a depressed mood. She exhibits abnormal recent memory.  Poor short term memory  Dementia with confabulation  occ repeats herself  Acts slightly paranoid and defensive          Assessment & Plan:   Problem List Items Addressed This Visit      Cardiovascular and Mediastinum   Essential hypertension - Primary    bp in fair control at this time  BP Readings from Last 1 Encounters:  09/10/17 135/70   No changes needed Disc lifstyle change with low sodium diet and exercise  Labs today  On losartan/hct  Enc fluid intake (per  daughter she drinks a lot of sodas but not water)       Relevant Medications   losartan-hydrochlorothiazide (HYZAAR) 100-12.5 MG tablet   Other Relevant Orders   CBC with Differential/Platelet (Completed)   Comprehensive metabolic panel (Completed)   Lipid panel (Completed)   TSH (Completed)     Endocrine   Hypothyroidism    TSH today  Adj dose if needed  No clinical changes except declining cognitive status       Relevant Medications   levothyroxine (SYNTHROID, LEVOTHROID) 50 MCG tablet   Other Relevant Orders   TSH (Completed)     Nervous and Auditory   Dementia    Spoke with daughter separately-this is worsening Dementia at this point Pt confabulates and talks about working (she is not) Declines tx or further eval  Daughter lives with her/cares for her and handles medication   (did get her private # today) Pt is paranoid about [place] talking to her (although not agitated) Intol of aricept in the past Controlling cholesterol  Disc safety issues         Other   B12 deficiency    B12 level today  Pt states she is not taking any      Relevant Orders   Vitamin B12 (Completed)   HYPERCHOLESTEROLEMIA, PURE    Lab today  Disc goals for lipids and reasons to control them Rev labs with pt Rev low sat fat diet in detail Intol of statins       Relevant Medications   losartan-hydrochlorothiazide (HYZAAR) 100-12.5 MG tablet   Other Relevant Orders   Lipid panel (Completed)

## 2017-09-10 NOTE — Patient Instructions (Addendum)
You need to drink more water for kidney health   64 oz of fluid  This is very very important   Take care of yourself  Stay as active as you can  Also socialize because this helps memory !   Don't forget to take your medicine

## 2017-09-10 NOTE — Assessment & Plan Note (Signed)
Lab today  Disc goals for lipids and reasons to control them Rev labs with pt Rev low sat fat diet in detail Intol of statins

## 2017-09-10 NOTE — Assessment & Plan Note (Signed)
bp in fair control at this time  BP Readings from Last 1 Encounters:  09/10/17 135/70   No changes needed Disc lifstyle change with low sodium diet and exercise  Labs today  On losartan/hct  Enc fluid intake (per daughter she drinks a lot of sodas but not water)

## 2017-09-10 NOTE — Assessment & Plan Note (Signed)
TSH today  Adj dose if needed  No clinical changes except declining cognitive status

## 2017-09-10 NOTE — Assessment & Plan Note (Signed)
B12 level today  Pt states she is not taking any

## 2017-09-10 NOTE — Assessment & Plan Note (Signed)
Spoke with daughter separately-this is worsening Dementia at this point Pt confabulates and talks about working (she is not) Declines tx or further eval  Daughter lives with her/cares for her and handles medication   (did get her private # today) Pt is paranoid about us talking to her (although not agitated) Intol of aricept in the past Controlling cholesterol  Disc safety issues

## 2017-09-20 ENCOUNTER — Encounter: Payer: Self-pay | Admitting: *Deleted

## 2017-10-18 DIAGNOSIS — M1712 Unilateral primary osteoarthritis, left knee: Secondary | ICD-10-CM | POA: Diagnosis not present

## 2017-10-18 DIAGNOSIS — M1711 Unilateral primary osteoarthritis, right knee: Secondary | ICD-10-CM | POA: Diagnosis not present

## 2017-10-18 DIAGNOSIS — M545 Low back pain: Secondary | ICD-10-CM | POA: Diagnosis not present

## 2017-10-18 DIAGNOSIS — M5126 Other intervertebral disc displacement, lumbar region: Secondary | ICD-10-CM | POA: Diagnosis not present

## 2017-10-18 DIAGNOSIS — M2351 Chronic instability of knee, right knee: Secondary | ICD-10-CM | POA: Diagnosis not present

## 2018-01-14 ENCOUNTER — Ambulatory Visit: Payer: Self-pay

## 2018-01-14 NOTE — Telephone Encounter (Signed)
Pt has appt with Dr Ermalene SearingBedsole on 01/15/18 at 10:45.

## 2018-01-14 NOTE — Telephone Encounter (Signed)
Pt.'s daughter reports pt. Has had decreased intake x 1 week. Also is weaker. Gets up and goes to the bathroom and goes back to bed.Requesting to have "someone come out to the house and evaluate her - like home health." Appointment made for tomorrow. Instructed daughter if pt. Becomes weaker or seems dehydrated , to go to ED. Verbalizes understanding.  Reason for Disposition . [1] MODERATE weakness (i.e., interferes with work, school, normal activities) AND [2] persists > 3 days  Answer Assessment - Initial Assessment Questions 1. DESCRIPTION: "Describe how you are feeling."     Weak 2. SEVERITY: "How bad is it?"  "Can you stand and walk?"   - MILD - Feels weak or tired, but does not interfere with work, school or normal activities   - MODERATE - Able to stand and walk; weakness interferes with work, school, or normal activities   - SEVERE - Unable to stand or walk     Moderate 3. ONSET:  "When did the weakness begin?"     1 week ago 4. CAUSE: "What do you think is causing the weakness?"     Unsure 5. MEDICINES: "Have you recently started a new medicine or had a change in the amount of a medicine?"     No 6. OTHER SYMPTOMS: "Do you have any other symptoms?" (e.g., chest pain, fever, cough, SOB, vomiting, diarrhea, bleeding)     Nausea 7. PREGNANCY: "Is there any chance you are pregnant?" "When was your last menstrual period?"     No  Protocols used: WEAKNESS (GENERALIZED) AND FATIGUE-A-AH

## 2018-01-15 ENCOUNTER — Ambulatory Visit: Payer: Medicare Other | Admitting: Family Medicine

## 2018-01-15 ENCOUNTER — Telehealth: Payer: Self-pay | Admitting: Family Medicine

## 2018-01-15 NOTE — Telephone Encounter (Signed)
Spoke with daughter and she said Rachel Knox refused to go to the doctor, Rachel Knox said she is fine and started getting really agitated and screaming at them when they tried to get her ready for her appt., daughter wanted Dr. Milinda Antisower to know she is really concern with her mothers status. Rachel Knox staying in the bed 24/7, and is sleepy all the time Rachel Knox also is very weak and fatigued, Rachel Knox is also refusing to eat or drink and even though family is trying their hardest to get her to she is still refusing to do so, daughter is concerned because when they try to get Rachel Knox help in her mind she tells them she is perfectly fine but family is aware it's due to her dementia, daughter wanted me to let Dr. Milinda Antisower know and also ask Dr. Milinda Antisower is there anything that she can recommend for them to do to help Rachel Knox   Call back on daughter's cell [phone]

## 2018-01-15 NOTE — Telephone Encounter (Signed)
Noted  

## 2018-01-15 NOTE — Telephone Encounter (Signed)
Copied from CRM [id number]. Topic: Quick Communication - See Telephone Encounter >> Jan 15, 2018  9:55 AM Jolayne Hainesaylor, Brittany L wrote: CRM for notification. See Telephone encounter for: 01/15/18.  Debarah CrapeClaudia ( daughter ) said she has an appointment today at 10:45 and she said she can not get her to get up and come and she is screaming at her. She said she knew this was going to happen and she wants the nurse of Dr Milinda Antisower to call her back as soon as possible. Call back @ [phone]. She asked me to cancel the appointment.

## 2018-01-16 NOTE — Telephone Encounter (Signed)
Daughter notified of Dr. Royden Purlower's comments she will keep working on trying to get pt to the office but pt is still refusing. She isn't in any danger or hurting or harming herself, daughter said she just feels like pt is "wilting away to nothing" and she is still concerned. But daughter will keep trying to get her out the house for an appt but pt has been still refusing to do so

## 2018-01-16 NOTE — Telephone Encounter (Signed)
If she is worse than usual- it would be best to be seen/run a urinalysis and make sure nothing else is adding to this.  I know that aricept was not tolerated.  If she is in danger of hurting herself- the ED would be a good idea (call EMS if unable to get her there)  I am limited because I am out of town this week

## 2018-01-17 NOTE — Telephone Encounter (Signed)
Thanks for letting me know - I am willing to try xanax (with caution) to calm her down if needed

## 2018-01-18 NOTE — Telephone Encounter (Signed)
Left VM requesting family to call office back

## 2018-01-29 ENCOUNTER — Emergency Department: Payer: Medicare Other

## 2018-01-29 ENCOUNTER — Inpatient Hospital Stay: Payer: Medicare Other | Admitting: Anesthesiology

## 2018-01-29 ENCOUNTER — Encounter: Admission: EM | Disposition: E | Payer: Self-pay | Source: Home / Self Care | Attending: Internal Medicine

## 2018-01-29 ENCOUNTER — Encounter: Payer: Self-pay | Admitting: Emergency Medicine

## 2018-01-29 ENCOUNTER — Other Ambulatory Visit: Payer: Self-pay

## 2018-01-29 ENCOUNTER — Inpatient Hospital Stay
Admission: EM | Admit: 2018-01-29 | Discharge: 2018-02-21 | DRG: 853 | Disposition: E | Payer: Medicare Other | Attending: Internal Medicine | Admitting: Internal Medicine

## 2018-01-29 DIAGNOSIS — Z90710 Acquired absence of both cervix and uterus: Secondary | ICD-10-CM

## 2018-01-29 DIAGNOSIS — E860 Dehydration: Secondary | ICD-10-CM | POA: Diagnosis not present

## 2018-01-29 DIAGNOSIS — R0602 Shortness of breath: Secondary | ICD-10-CM | POA: Diagnosis not present

## 2018-01-29 DIAGNOSIS — M1990 Unspecified osteoarthritis, unspecified site: Secondary | ICD-10-CM | POA: Diagnosis present

## 2018-01-29 DIAGNOSIS — K551 Chronic vascular disorders of intestine: Secondary | ICD-10-CM | POA: Diagnosis present

## 2018-01-29 DIAGNOSIS — Z79890 Hormone replacement therapy (postmenopausal): Secondary | ICD-10-CM

## 2018-01-29 DIAGNOSIS — R6520 Severe sepsis without septic shock: Secondary | ICD-10-CM | POA: Diagnosis not present

## 2018-01-29 DIAGNOSIS — Z66 Do not resuscitate: Secondary | ICD-10-CM | POA: Diagnosis not present

## 2018-01-29 DIAGNOSIS — I248 Other forms of acute ischemic heart disease: Secondary | ICD-10-CM | POA: Diagnosis not present

## 2018-01-29 DIAGNOSIS — R109 Unspecified abdominal pain: Secondary | ICD-10-CM | POA: Diagnosis present

## 2018-01-29 DIAGNOSIS — G9340 Encephalopathy, unspecified: Secondary | ICD-10-CM | POA: Diagnosis not present

## 2018-01-29 DIAGNOSIS — K529 Noninfective gastroenteritis and colitis, unspecified: Secondary | ICD-10-CM | POA: Diagnosis not present

## 2018-01-29 DIAGNOSIS — Z9049 Acquired absence of other specified parts of digestive tract: Secondary | ICD-10-CM

## 2018-01-29 DIAGNOSIS — E876 Hypokalemia: Secondary | ICD-10-CM | POA: Diagnosis not present

## 2018-01-29 DIAGNOSIS — Z888 Allergy status to other drugs, medicaments and biological substances status: Secondary | ICD-10-CM

## 2018-01-29 DIAGNOSIS — I4891 Unspecified atrial fibrillation: Secondary | ICD-10-CM | POA: Diagnosis not present

## 2018-01-29 DIAGNOSIS — G9341 Metabolic encephalopathy: Secondary | ICD-10-CM | POA: Diagnosis present

## 2018-01-29 DIAGNOSIS — Z8249 Family history of ischemic heart disease and other diseases of the circulatory system: Secondary | ICD-10-CM

## 2018-01-29 DIAGNOSIS — J80 Acute respiratory distress syndrome: Secondary | ICD-10-CM | POA: Diagnosis not present

## 2018-01-29 DIAGNOSIS — E785 Hyperlipidemia, unspecified: Secondary | ICD-10-CM | POA: Diagnosis present

## 2018-01-29 DIAGNOSIS — I10 Essential (primary) hypertension: Secondary | ICD-10-CM | POA: Diagnosis not present

## 2018-01-29 DIAGNOSIS — K589 Irritable bowel syndrome without diarrhea: Secondary | ICD-10-CM | POA: Diagnosis present

## 2018-01-29 DIAGNOSIS — K55059 Acute (reversible) ischemia of intestine, part and extent unspecified: Secondary | ICD-10-CM | POA: Diagnosis not present

## 2018-01-29 DIAGNOSIS — A419 Sepsis, unspecified organism: Secondary | ICD-10-CM | POA: Diagnosis not present

## 2018-01-29 DIAGNOSIS — E039 Hypothyroidism, unspecified: Secondary | ICD-10-CM | POA: Diagnosis present

## 2018-01-29 DIAGNOSIS — K219 Gastro-esophageal reflux disease without esophagitis: Secondary | ICD-10-CM | POA: Diagnosis not present

## 2018-01-29 DIAGNOSIS — R7989 Other specified abnormal findings of blood chemistry: Secondary | ICD-10-CM

## 2018-01-29 DIAGNOSIS — R627 Adult failure to thrive: Secondary | ICD-10-CM | POA: Diagnosis not present

## 2018-01-29 DIAGNOSIS — E872 Acidosis, unspecified: Secondary | ICD-10-CM

## 2018-01-29 DIAGNOSIS — R1084 Generalized abdominal pain: Secondary | ICD-10-CM | POA: Diagnosis not present

## 2018-01-29 DIAGNOSIS — Z881 Allergy status to other antibiotic agents status: Secondary | ICD-10-CM

## 2018-01-29 DIAGNOSIS — R531 Weakness: Secondary | ICD-10-CM | POA: Diagnosis not present

## 2018-01-29 DIAGNOSIS — Z515 Encounter for palliative care: Secondary | ICD-10-CM | POA: Diagnosis present

## 2018-01-29 DIAGNOSIS — Z79899 Other long term (current) drug therapy: Secondary | ICD-10-CM

## 2018-01-29 DIAGNOSIS — F0390 Unspecified dementia without behavioral disturbance: Secondary | ICD-10-CM | POA: Diagnosis present

## 2018-01-29 DIAGNOSIS — Z886 Allergy status to analgesic agent status: Secondary | ICD-10-CM

## 2018-01-29 DIAGNOSIS — N179 Acute kidney failure, unspecified: Secondary | ICD-10-CM | POA: Diagnosis present

## 2018-01-29 DIAGNOSIS — E7800 Pure hypercholesterolemia, unspecified: Secondary | ICD-10-CM | POA: Diagnosis not present

## 2018-01-29 DIAGNOSIS — Z87891 Personal history of nicotine dependence: Secondary | ICD-10-CM

## 2018-01-29 DIAGNOSIS — Z7401 Bed confinement status: Secondary | ICD-10-CM

## 2018-01-29 DIAGNOSIS — K429 Umbilical hernia without obstruction or gangrene: Secondary | ICD-10-CM | POA: Diagnosis not present

## 2018-01-29 DIAGNOSIS — K559 Vascular disorder of intestine, unspecified: Secondary | ICD-10-CM | POA: Diagnosis present

## 2018-01-29 DIAGNOSIS — R778 Other specified abnormalities of plasma proteins: Secondary | ICD-10-CM

## 2018-01-29 HISTORY — PX: VISCERAL ARTERY INTERVENTION: CATH118277

## 2018-01-29 LAB — TROPONIN I
TROPONIN I: 0.29 ng/mL — AB (ref <0.03)
Troponin I: 0.43 ng/mL (ref <0.03)

## 2018-01-29 LAB — GLUCOSE, CAPILLARY
GLUCOSE-CAPILLARY: 106 mg/dL — AB (ref 70–99)
GLUCOSE-CAPILLARY: 133 mg/dL — AB (ref 70–99)

## 2018-01-29 LAB — COMPREHENSIVE METABOLIC PANEL
ALBUMIN: 2.7 g/dL — AB (ref 3.5–5.0)
ALT: 44 U/L (ref 0–44)
AST: 73 U/L — AB (ref 15–41)
Alkaline Phosphatase: 87 U/L (ref 38–126)
Anion gap: 16 — ABNORMAL HIGH (ref 5–15)
BUN: 24 mg/dL — ABNORMAL HIGH (ref 8–23)
CALCIUM: 8.6 mg/dL — AB (ref 8.9–10.3)
CO2: 26 mmol/L (ref 22–32)
Chloride: 97 mmol/L — ABNORMAL LOW (ref 98–111)
Creatinine, Ser: 1.3 mg/dL — ABNORMAL HIGH (ref 0.44–1.00)
GFR calc Af Amer: 41 mL/min — ABNORMAL LOW (ref >60)
GFR calc non Af Amer: 36 mL/min — ABNORMAL LOW (ref >60)
Glucose, Bld: 127 mg/dL — ABNORMAL HIGH (ref 70–99)
POTASSIUM: 3.1 mmol/L — AB (ref 3.5–5.1)
Sodium: 139 mmol/L (ref 135–145)
Total Bilirubin: 0.8 mg/dL (ref 0.3–1.2)
Total Protein: 5.3 g/dL — ABNORMAL LOW (ref 6.5–8.1)

## 2018-01-29 LAB — MAGNESIUM: Magnesium: 1.4 mg/dL — ABNORMAL LOW (ref 1.7–2.4)

## 2018-01-29 LAB — CBC WITH DIFFERENTIAL/PLATELET
BASOS ABS: 0 10*3/uL (ref 0–0.1)
Basophils Relative: 0 %
EOS PCT: 0 %
Eosinophils Absolute: 0 10*3/uL (ref 0–0.7)
HCT: 42.1 % (ref 35.0–47.0)
Hemoglobin: 13.9 g/dL (ref 12.0–16.0)
LYMPHS PCT: 31 %
Lymphs Abs: 4.6 10*3/uL — ABNORMAL HIGH (ref 1.0–3.6)
MCH: 31.4 pg (ref 26.0–34.0)
MCHC: 33 g/dL (ref 32.0–36.0)
MCV: 95.3 fL (ref 80.0–100.0)
Monocytes Absolute: 0.7 10*3/uL (ref 0.2–0.9)
Monocytes Relative: 5 %
Neutro Abs: 9.5 10*3/uL — ABNORMAL HIGH (ref 1.4–6.5)
Neutrophils Relative %: 64 %
PLATELETS: 227 10*3/uL (ref 150–440)
RBC: 4.41 MIL/uL (ref 3.80–5.20)
RDW: 15.1 % — ABNORMAL HIGH (ref 11.5–14.5)
WBC: 14.9 10*3/uL — AB (ref 3.6–11.0)

## 2018-01-29 LAB — URINALYSIS, COMPLETE (UACMP) WITH MICROSCOPIC
Bacteria, UA: NONE SEEN
Bilirubin Urine: NEGATIVE
Glucose, UA: NEGATIVE mg/dL
HGB URINE DIPSTICK: NEGATIVE
Ketones, ur: NEGATIVE mg/dL
Leukocytes, UA: NEGATIVE
NITRITE: NEGATIVE
Protein, ur: 30 mg/dL — AB
Specific Gravity, Urine: 1.017 (ref 1.005–1.030)
pH: 6 (ref 5.0–8.0)

## 2018-01-29 LAB — HEPARIN LEVEL (UNFRACTIONATED): Heparin Unfractionated: 0.77 IU/mL — ABNORMAL HIGH (ref 0.30–0.70)

## 2018-01-29 LAB — LACTIC ACID, PLASMA
Lactic Acid, Venous: 4.3 mmol/L (ref 0.5–1.9)
Lactic Acid, Venous: 3.1 mmol/L (ref 0.5–1.9)

## 2018-01-29 LAB — PROTIME-INR
INR: 1.35
Prothrombin Time: 16.6 seconds — ABNORMAL HIGH (ref 11.4–15.2)

## 2018-01-29 LAB — PHOSPHORUS: PHOSPHORUS: 3.1 mg/dL (ref 2.5–4.6)

## 2018-01-29 LAB — LIPASE, BLOOD: Lipase: 29 U/L (ref 11–51)

## 2018-01-29 LAB — APTT: aPTT: 31 seconds (ref 24–36)

## 2018-01-29 SURGERY — VISCERAL ARTERY INTERVENTION
Anesthesia: General

## 2018-01-29 MED ORDER — DEXAMETHASONE SODIUM PHOSPHATE 10 MG/ML IJ SOLN
INTRAMUSCULAR | Status: DC | PRN
  Administered 2018-01-29: 5 mg via INTRAVENOUS

## 2018-01-29 MED ORDER — IOHEXOL 300 MG/ML  SOLN
75.0000 mL | Freq: Once | INTRAMUSCULAR | Status: AC | PRN
  Administered 2018-01-29: 75 mL via INTRAVENOUS

## 2018-01-29 MED ORDER — HEPARIN (PORCINE) IN NACL 100-0.45 UNIT/ML-% IJ SOLN
750.0000 [IU]/h | INTRAMUSCULAR | Status: DC
  Administered 2018-01-29 (×2): 850 [IU]/h via INTRAVENOUS
  Filled 2018-01-29 (×2): qty 250

## 2018-01-29 MED ORDER — SODIUM CHLORIDE 0.9 % IJ SOLN
INTRAMUSCULAR | Status: AC
  Filled 2018-01-29: qty 50

## 2018-01-29 MED ORDER — SODIUM CHLORIDE 0.9 % IV SOLN
INTRAVENOUS | Status: DC | PRN
  Administered 2018-01-29: 16:00:00 via INTRAVENOUS

## 2018-01-29 MED ORDER — SODIUM CHLORIDE 0.9 % IV SOLN: 250.0000 mL | INTRAVENOUS | Status: DC | PRN

## 2018-01-29 MED ORDER — FENTANYL CITRATE (PF) 100 MCG/2ML IJ SOLN
25.0000 ug | Freq: Once | INTRAMUSCULAR | Status: AC
  Administered 2018-01-29: 25 ug via INTRAVENOUS
  Filled 2018-01-29: qty 2

## 2018-01-29 MED ORDER — ACETAMINOPHEN 650 MG RE SUPP: 650.0000 mg | Freq: Four times a day (QID) | RECTAL | Status: DC | PRN

## 2018-01-29 MED ORDER — LIDOCAINE HCL (PF) 1 % IJ SOLN
INTRAMUSCULAR | Status: AC
  Filled 2018-01-29: qty 30

## 2018-01-29 MED ORDER — NITROGLYCERIN 5 MG/ML IV SOLN
INTRAVENOUS | Status: AC
  Filled 2018-01-29: qty 10

## 2018-01-29 MED ORDER — ONDANSETRON HCL 4 MG/2ML IJ SOLN
INTRAMUSCULAR | Status: AC
  Filled 2018-01-29: qty 2

## 2018-01-29 MED ORDER — MIDAZOLAM HCL 2 MG/2ML IJ SOLN
INTRAMUSCULAR | Status: AC
  Filled 2018-01-29: qty 2

## 2018-01-29 MED ORDER — PHENYLEPHRINE HCL 10 MG/ML IJ SOLN
INTRAMUSCULAR | Status: DC | PRN
  Administered 2018-01-29 (×9): 100 ug via INTRAVENOUS

## 2018-01-29 MED ORDER — HEPARIN (PORCINE) IN NACL 100-0.45 UNIT/ML-% IJ SOLN
850.0000 [IU]/h | INTRAMUSCULAR | Status: DC
Start: 2018-01-29 — End: 2018-01-29
  Administered 2018-01-29: 850 [IU]/h via INTRAVENOUS
  Filled 2018-01-29: qty 250

## 2018-01-29 MED ORDER — PROPOFOL 10 MG/ML IV BOLUS
INTRAVENOUS | Status: DC | PRN
  Administered 2018-01-29: 60 mg via INTRAVENOUS

## 2018-01-29 MED ORDER — ONDANSETRON HCL 4 MG/2ML IJ SOLN
INTRAMUSCULAR | Status: DC | PRN
  Administered 2018-01-29: 4 mg via INTRAVENOUS

## 2018-01-29 MED ORDER — POTASSIUM CHLORIDE 2 MEQ/ML IV SOLN
INTRAVENOUS | Status: DC
  Administered 2018-01-29 – 2018-01-30 (×3): via INTRAVENOUS
  Filled 2018-01-29 (×4): qty 1000

## 2018-01-29 MED ORDER — MAGNESIUM SULFATE 2 GM/50ML IV SOLN
2.0000 g | Freq: Once | INTRAVENOUS | Status: AC
  Administered 2018-01-29: 2 g via INTRAVENOUS
  Filled 2018-01-29: qty 50

## 2018-01-29 MED ORDER — SODIUM CHLORIDE 0.9% FLUSH: 3.0000 mL | INTRAVENOUS | Status: DC | PRN

## 2018-01-29 MED ORDER — HEPARIN (PORCINE) IN NACL 1000-0.9 UT/500ML-% IV SOLN
INTRAVENOUS | Status: AC
  Filled 2018-01-29: qty 1000

## 2018-01-29 MED ORDER — PHENYLEPHRINE HCL 10 MG/ML IJ SOLN
INTRAMUSCULAR | Status: AC
  Filled 2018-01-29: qty 1

## 2018-01-29 MED ORDER — FENTANYL CITRATE (PF) 100 MCG/2ML IJ SOLN: 25.0000 ug | INTRAMUSCULAR | Status: DC | PRN

## 2018-01-29 MED ORDER — DEXAMETHASONE SODIUM PHOSPHATE 10 MG/ML IJ SOLN
INTRAMUSCULAR | Status: AC
  Filled 2018-01-29: qty 1

## 2018-01-29 MED ORDER — ROCURONIUM BROMIDE 100 MG/10ML IV SOLN
INTRAVENOUS | Status: DC | PRN
  Administered 2018-01-29: 10 mg via INTRAVENOUS
  Administered 2018-01-29: 30 mg via INTRAVENOUS

## 2018-01-29 MED ORDER — SODIUM CHLORIDE 0.9 % IV BOLUS
500.0000 mL | Freq: Once | INTRAVENOUS | Status: AC
  Administered 2018-01-29: 500 mL via INTRAVENOUS

## 2018-01-29 MED ORDER — ONDANSETRON HCL 4 MG/2ML IJ SOLN: 4.0000 mg | Freq: Once | INTRAMUSCULAR | Status: DC | PRN

## 2018-01-29 MED ORDER — MORPHINE SULFATE (PF) 2 MG/ML IV SOLN
2.0000 mg | INTRAVENOUS | Status: DC | PRN
  Administered 2018-01-29 – 2018-01-30 (×5): 2 mg via INTRAVENOUS
  Filled 2018-01-29 (×7): qty 1

## 2018-01-29 MED ORDER — ONDANSETRON HCL 4 MG/2ML IJ SOLN
4.0000 mg | Freq: Four times a day (QID) | INTRAMUSCULAR | Status: DC | PRN
  Administered 2018-01-30: 4 mg via INTRAVENOUS
  Filled 2018-01-29: qty 2

## 2018-01-29 MED ORDER — SODIUM CHLORIDE 0.9 % IV SOLN
INTRAVENOUS | Status: AC
  Administered 2018-01-29: 19:00:00 via INTRAVENOUS

## 2018-01-29 MED ORDER — PIPERACILLIN-TAZOBACTAM 3.375 G IVPB
3.3750 g | Freq: Three times a day (TID) | INTRAVENOUS | Status: DC
  Administered 2018-01-29 – 2018-01-30 (×3): 3.375 g via INTRAVENOUS
  Filled 2018-01-29 (×3): qty 50

## 2018-01-29 MED ORDER — IOPAMIDOL (ISOVUE-300) INJECTION 61%
INTRAVENOUS | Status: DC | PRN
  Administered 2018-01-29: 55 mL via INTRA_ARTERIAL

## 2018-01-29 MED ORDER — SUGAMMADEX SODIUM 200 MG/2ML IV SOLN
INTRAVENOUS | Status: DC | PRN
  Administered 2018-01-29: 110 mg via INTRAVENOUS

## 2018-01-29 MED ORDER — SUGAMMADEX SODIUM 200 MG/2ML IV SOLN
INTRAVENOUS | Status: AC
  Filled 2018-01-29: qty 2

## 2018-01-29 MED ORDER — SODIUM CHLORIDE 0.9% FLUSH
3.0000 mL | Freq: Two times a day (BID) | INTRAVENOUS | Status: DC
  Administered 2018-01-29 – 2018-01-30 (×2): 3 mL via INTRAVENOUS

## 2018-01-29 MED ORDER — SODIUM CHLORIDE 0.9 % IV SOLN: INTRAVENOUS | Status: DC

## 2018-01-29 MED ORDER — PIPERACILLIN-TAZOBACTAM 3.375 G IVPB 30 MIN
3.3750 g | Freq: Once | INTRAVENOUS | Status: AC
  Administered 2018-01-29: 3.375 g via INTRAVENOUS
  Filled 2018-01-29: qty 50

## 2018-01-29 MED ORDER — ONDANSETRON HCL 4 MG PO TABS: 4.0000 mg | ORAL_TABLET | Freq: Four times a day (QID) | ORAL | Status: DC | PRN

## 2018-01-29 MED ORDER — GLYCOPYRROLATE 0.2 MG/ML IJ SOLN
0.1000 mg | Freq: Once | INTRAMUSCULAR | Status: AC
  Administered 2018-01-29: 0.1 mg via INTRAVENOUS
  Filled 2018-01-29: qty 0.5

## 2018-01-29 MED ORDER — HEPARIN BOLUS VIA INFUSION
2600.0000 [IU] | Freq: Once | INTRAVENOUS | Status: AC
Start: 2018-01-29 — End: 2018-01-29
  Administered 2018-01-29: 2600 [IU] via INTRAVENOUS
  Filled 2018-01-29: qty 2600

## 2018-01-29 MED ORDER — ORAL CARE MOUTH RINSE
15.0000 mL | Freq: Two times a day (BID) | OROMUCOSAL | Status: DC
  Administered 2018-01-29 – 2018-01-30 (×2): 15 mL via OROMUCOSAL

## 2018-01-29 MED ORDER — PROPOFOL 10 MG/ML IV BOLUS
INTRAVENOUS | Status: AC
  Filled 2018-01-29: qty 20

## 2018-01-29 MED ORDER — ACETAMINOPHEN 325 MG PO TABS: 650.0000 mg | ORAL_TABLET | Freq: Four times a day (QID) | ORAL | Status: DC | PRN

## 2018-01-29 MED ORDER — BISACODYL 5 MG PO TBEC
5.0000 mg | DELAYED_RELEASE_TABLET | Freq: Every day | ORAL | Status: DC | PRN
  Filled 2018-01-29: qty 1

## 2018-01-29 MED ORDER — HEPARIN SODIUM (PORCINE) 1000 UNIT/ML IJ SOLN
INTRAMUSCULAR | Status: DC | PRN
  Administered 2018-01-29: 1000 [IU] via INTRAVENOUS

## 2018-01-29 MED ORDER — SUCCINYLCHOLINE CHLORIDE 20 MG/ML IJ SOLN
INTRAMUSCULAR | Status: DC | PRN
  Administered 2018-01-29: 40 mg via INTRAVENOUS

## 2018-01-29 MED ORDER — LIDOCAINE HCL (CARDIAC) PF 100 MG/5ML IV SOSY
PREFILLED_SYRINGE | INTRAVENOUS | Status: DC | PRN
  Administered 2018-01-29: 40 mg via INTRAVENOUS

## 2018-01-29 MED ORDER — CEFAZOLIN SODIUM-DEXTROSE 2-3 GM-%(50ML) IV SOLR
INTRAVENOUS | Status: DC | PRN
  Administered 2018-01-29: 2 g via INTRAVENOUS

## 2018-01-29 MED ORDER — NITROGLYCERIN 1 MG/10 ML FOR IR/CATH LAB
INTRA_ARTERIAL | Status: DC | PRN
  Administered 2018-01-29: 250 ug via INTRA_ARTERIAL

## 2018-01-29 MED ORDER — FENTANYL CITRATE (PF) 100 MCG/2ML IJ SOLN
INTRAMUSCULAR | Status: AC
  Filled 2018-01-29: qty 2

## 2018-01-29 SURGICAL SUPPLY — 25 items
BALLN ULTRVRSE 2X150X150 (BALLOONS) ×2
BALLN ULTRVRSE 2X150X150 OTW (BALLOONS) ×1
BALLOON ULTRVRSE 2X150X150 OTW (BALLOONS) ×1 IMPLANT
CANISTER PENUMBRA ENGINE (MISCELLANEOUS) ×3 IMPLANT
CATH BEACON 5 .038 100 VERT TP (CATHETERS) ×3 IMPLANT
CATH INDIGO CAT6 KIT (CATHETERS) ×3 IMPLANT
CATH INDIGO SEP 6 (CATHETERS) ×3 IMPLANT
CATH PIG 70CM (CATHETERS) ×3 IMPLANT
CATH VS15FR (CATHETERS) ×3 IMPLANT
DEVICE PRESTO INFLATION (MISCELLANEOUS) ×3 IMPLANT
DEVICE STARCLOSE SE CLOSURE (Vascular Products) ×3 IMPLANT
DEVICE TORQUE .025-.038 (MISCELLANEOUS) ×3 IMPLANT
GLIDECATH 4FR STR (CATHETERS) ×3 IMPLANT
GLIDEWIRE ADV .014X300CM (WIRE) ×3 IMPLANT
GLIDEWIRE STIFF .35X180X3 HYDR (WIRE) ×3 IMPLANT
NEEDLE ENTRY 21GA 7CM ECHOTIP (NEEDLE) ×3 IMPLANT
PACK ANGIOGRAPHY (CUSTOM PROCEDURE TRAY) ×3 IMPLANT
SET INTRO CAPELLA COAXIAL (SET/KITS/TRAYS/PACK) ×3 IMPLANT
SHEATH BRITE TIP 5FRX11 (SHEATH) ×3 IMPLANT
SHEATH BRITE TIP 7FRX11 (SHEATH) ×3 IMPLANT
SHEATH HIGHFLEX ANSEL 6FRX55 (SHEATH) ×3 IMPLANT
SHEATH PINNACLE ST 6F 45CM (SHEATH) ×3 IMPLANT
TUBING CONTRAST HIGH PRESS 72 (TUBING) ×3 IMPLANT
WIRE J 3MM .035X145CM (WIRE) ×3 IMPLANT
WIRE MAGIC TORQUE 260C (WIRE) ×3 IMPLANT

## 2018-01-29 NOTE — Progress Notes (Signed)
Patient taken down to vascular for procedure with Dr Gilda CreaseSchnier.

## 2018-01-29 NOTE — ED Triage Notes (Signed)
Pt to room 2 via w/c, generalized weakness noted, skin pale; daughter reports pt "sick for weeks" but refused to go to doctor; tonight having c/o lower abd pain

## 2018-01-29 NOTE — Transfer of Care (Signed)
Immediate Anesthesia Transfer of Care Note  Patient: Rachel Knox  Procedure(s) Performed: VISCERAL ARTERY INTERVENTION (N/A )  Patient Location: PACU  Anesthesia Type:General  Level of Consciousness: sedated  Airway & Oxygen Therapy: Patient Spontanous Breathing and Patient connected to face mask oxygen  Post-op Assessment: Report given to RN and Post -op Vital signs reviewed and stable  Post vital signs: Reviewed and stable  Last Vitals:  Vitals Value Taken Time  BP 107/48   Temp    Pulse 111 01/24/2018  6:12 PM  Resp 25 02/18/2018  6:12 PM  SpO2 94 % 01/23/2018  6:12 PM  Vitals shown include unvalidated device data.  Last Pain:  Vitals:   2017/08/09 1618  TempSrc: Axillary         Complications: No apparent anesthesia complications

## 2018-01-29 NOTE — ED Notes (Signed)
Date and time results received: 20-Sep-2017 6:23 AM   Test: Lactic Acid Critical Value: lactic Acid: 4.3  Name of Provider Notified: Dr. Lamont Snowballifenbark

## 2018-01-29 NOTE — Progress Notes (Signed)
Pharmacy Electrolyte Monitoring Consult:  Pharmacy consulted to assist in monitoring and replacing electrolytes in this 84 y.o. female admitted on 02/06/2018 with Abdominal Pain   Labs:  Sodium (mmol/L)  Date Value  2017/12/23 139  02/02/2014 138   Potassium (mmol/L)  Date Value  2017/12/23 3.1 (L)  02/02/2014 3.5   Magnesium (mg/dL)  Date Value  09/81/19142019/06/02 1.4 (L)   Phosphorus (mg/dL)  Date Value  78/29/56212019/06/02 3.1   Calcium (mg/dL)  Date Value  30/86/57842019/06/02 8.6 (L)   Calcium, Total (mg/dL)  Date Value  69/62/952807/13/2015 9.2   Albumin (g/dL)  Date Value  41/32/44012019/06/02 2.7 (L)  02/02/2014 3.8    Assessment/Plan: Per conversation with CCM will transition patient to LR/7730mEq of potassium at 17400mL/hr.  Will replace magnesium 2g IV x 1.   Will recheck electrolytes with am labs.   Pharmacy will continue to monitor and adjust per consult.   Simpson,Michael L 01/28/2018 3:59 PM

## 2018-01-29 NOTE — Progress Notes (Signed)
ANTICOAGULATION CONSULT NOTE - Initial Consult  Pharmacy Consult for Heparin Drip Indication: Mesenteric Ischemia  Allergies  Allergen Reactions  . Amlodipine Besylate     REACTION: swelling  . Aricept [Donepezil Hcl] Diarrhea  . Atorvastatin     REACTION: unspecified  . Azithromycin     REACTION: nausea and vomiting  . Clarithromycin     REACTION: severe diarrhea  . Ibuprofen     REACTION: unspecified  . Levofloxacin     REACTION: diarrhea  . Piroxicam     REACTION: unspecified  . Simvastatin     Cramps in her feet  . Metoprolol     Asymptomatic but severe bradycardia    Patient Measurements: Height: 5\' 5"  (165.1 cm) Weight: 116 lb 8 oz (52.8 kg) IBW/kg (Calculated) : 57 Heparin Dosing Weight: 52.8 kg  Vital Signs: Temp: 97.7 F (36.5 C) (07/09 2000) Temp Source: Axillary (07/09 1618) BP: 112/65 (07/09 2000) Pulse Rate: 115 (07/09 2000)  Labs: Recent Labs    12-30-2017 0537 12-30-2017 0749 12-30-2017 2055  HGB 13.9  --   --   HCT 42.1  --   --   PLT 227  --   --   APTT  --  31  --   LABPROT  --  16.6*  --   INR  --  1.35  --   HEPARINUNFRC  --   --  0.77*  CREATININE 1.30*  --   --   TROPONINI 0.29*  --   --     Estimated Creatinine Clearance: 24.9 mL/min (A) (by C-G formula based on SCr of 1.3 mg/dL (H)).   Medical History: Past Medical History:  Diagnosis Date  . Diverticulosis of colon   . Hyperlipidemia   . Hypertension   . IBS (irritable bowel syndrome)   . Osteoarthritis   . Urinary incontinence     Medications:  Scheduled:  . sodium chloride flush  3 mL Intravenous Q12H   Infusions:  . sodium chloride    . heparin 850 Units/hr (12-30-2017 1951)  . lactated ringers with kcl 100 mL/hr at 12-30-2017 1539  . piperacillin-tazobactam (ZOSYN)  IV 3.375 g (12-30-2017 1338)    Assessment:  84 yo F to start Heparin drip for mesenteric/bowel ischemia. Patient also with Afib per ED MD note.  Goal of Therapy:  Heparin level 0.3-0.7  units/ml Monitor platelets by anticoagulation protocol: Yes   Plan:  Patient received heparin bolus 2600 units at 0946 and Heparin drip was initiated at 0947. Heparin drip was stopped at 1045. Patient's heparin drip was resumed at 1227. Will obtain heparin level at 2100. There is concern that patient also has a GI Bleed, will defer additional heparin boluses x 24 hours and reevaluate on AM rounds.    7/9 @ 2055 HL 0.77. Level is supratherapeutic. Will decrease current infusion to heparin 750units/hr and recheck HL in 8 hours. CBC with AM labs per protocol.   Pharmacy will continue to monitor and adjust per consult.    Gardner CandleSheema M Ranisha Allaire, PharmD, BCPS Clinical Pharmacist 02/01/2018 9:21 PM

## 2018-01-29 NOTE — ED Notes (Signed)
This RN spoke with Dr. Luberta MutterKonidena regarding heparin drip. Per MD start heparin drip. This RN reiterated that patient had positive hemoccult and bloody stools, MD aware. Per Dr. Luberta MutterKonidena, start heparin drip.

## 2018-01-29 NOTE — Progress Notes (Signed)
Pharmacy Antibiotic Note  Rachel Knox is a 84 y.o. female admitted on 02/07/2018 with sepsis/possible intra-abdominal infection. Pharmacy has been consulted for Zosyn dosing.  Plan: Zosyn 3.375g IV q8h (4 hour infusion).  Height: 5\' 5"  (165.1 cm) Weight: 116 lb 8 oz (52.8 kg) IBW/kg (Calculated) : 57  Temp (24hrs), Avg:97.6 F (36.4 C), Min:97.6 F (36.4 C), Max:97.6 F (36.4 C)  Recent Labs  Lab November 17, 2017 0537 November 17, 2017 0749  WBC 14.9*  --   CREATININE 1.30*  --   LATICACIDVEN 4.3* 3.1*    Estimated Creatinine Clearance: 24.9 mL/min (A) (by C-G formula based on SCr of 1.3 mg/dL (H)).    Allergies  Allergen Reactions  . Amlodipine Besylate     REACTION: swelling  . Aricept [Donepezil Hcl] Diarrhea  . Atorvastatin     REACTION: unspecified  . Azithromycin     REACTION: nausea and vomiting  . Clarithromycin     REACTION: severe diarrhea  . Ibuprofen     REACTION: unspecified  . Levofloxacin     REACTION: diarrhea  . Piroxicam     REACTION: unspecified  . Simvastatin     Cramps in her feet  . Metoprolol     Asymptomatic but severe bradycardia    Antimicrobials this admission: Zosyn 7/9 >>       >>    Dose adjustments this admission:    Microbiology results: 7/9 BCx: pending   UCx:      Sputum:      MRSA PCR:    Thank you for allowing pharmacy to be a part of this patient's care.  Niel Peretti A 02/15/2018 9:20 AM

## 2018-01-29 NOTE — Progress Notes (Signed)
Family Meeting Note  Advance Directive:yes  Today a meeting took place with the Daughter.  Discussed the course of the patient's daughter, she mentioned that she did not want any surgery, mentioned no CPR, ventilator. Mentioned that she has ischemic  Bowel with sepsis  and  mortality rate is very high she understands.   The following clinical team members were present during this meeting:MD  The following were discussed:Patient's diagnosis: , Patient's progosis: Unable to determine and Goals for treatment: DNR  Additional follow-up to be provided:   Time spent during discussion:30 minutes  Katha HammingSnehalatha Taiwan Talcott, MD

## 2018-01-29 NOTE — Consult Note (Signed)
Surgical Consultation  01/27/2018  Rachel Knox is an 84 y.o. female.   Referring Physician: Dr Mable Paris, ED  CC: Abdominal pain  HPI: This patient was called emergently to see who was showing signs of hypotension and tachycardia and lactic acidosis and a work-up suggesting bowel ischemia.  Patient is demented and cannot answer questions herself with any accuracy but her daughter is present and another family member as well.  Apparently the patient was doing poorly for the last 2 weeks being bedridden which is different than what she had been previous to that.  Her daughter states that she was worried about her becoming dehydrated because she stopped drinking.  She was assisting with giving fluids orally to maintain hydration.  This is paraphrasing her words.  Patient apparently did not have any abdominal pain until the acute onset of pain this morning.  Is also had a dark bowel movement the ED.  Review of systems and history from the patient is not obtainable due to her medical condition and some level of dementia.  Spoken to Dr. Mable Paris concerning this patient's care on 2 occasions.  Past Medical History:  Diagnosis Date  . Diverticulosis of colon   . Hyperlipidemia   . Hypertension   . IBS (irritable bowel syndrome)   . Osteoarthritis   . Urinary incontinence     Past Surgical History:  Procedure Laterality Date  . ABDOMINAL HYSTERECTOMY    . APPENDECTOMY     with infection (surgery x2)  . BLADDER SURGERY     bladder tack  . BREAST CYST EXCISION  08/2002   breast discharge, cyst removed  . BREAST SURGERY     breast cyst needle biopsy  . FOOT NEUROMA SURGERY    . SHOULDER SURGERY     spur  . TONSILLECTOMY      Family History  Problem Relation Age of Onset  . Cancer Mother        kidney cancer  . Heart disease Father        MI  . Cancer Sister        lung  . Heart disease Sister        MI    Social History:  reports that she has quit smoking. She has  never used smokeless tobacco. She reports that she does not drink alcohol or use drugs.  Allergies:  Allergies  Allergen Reactions  . Amlodipine Besylate     REACTION: swelling  . Aricept [Donepezil Hcl] Diarrhea  . Atorvastatin     REACTION: unspecified  . Azithromycin     REACTION: nausea and vomiting  . Clarithromycin     REACTION: severe diarrhea  . Ibuprofen     REACTION: unspecified  . Levofloxacin     REACTION: diarrhea  . Piroxicam     REACTION: unspecified  . Simvastatin     Cramps in her feet  . Metoprolol     Asymptomatic but severe bradycardia    Medications reviewed.   Review of Systems:   Review of Systems  Unable to perform ROS: Critical illness     Physical Exam:  BP 126/68   Pulse (!) 119   Temp 97.6 F (36.4 C) (Rectal)   Resp (!) 23   Ht 5' 5"  (1.651 m)   Wt 116 lb 8 oz (52.8 kg)   LMP 07/24/1980   SpO2 97%   BMI 19.39 kg/m   Physical Exam  Constitutional:  Non-toxic appearance. She appears ill.  Thin frail elderly  female patient preferring to lie on her left side.  HENT:  Head: Normocephalic and atraumatic.  Eyes: Pupils are equal, round, and reactive to light. EOM are normal.  Cardiovascular:  Atrial fibrillation heart rate in the 140s to 130s  Pulmonary/Chest: Effort normal. No respiratory distress. She has no wheezes. She exhibits no tenderness.  Abdominal: Normal appearance. She exhibits distension. There is generalized tenderness. There is no rebound and no guarding. A hernia is present.  Moderate tenderness diffusely but both lower quadrants mostly.  Minimal percussion tenderness no rebound or guarding. Small reducible umbilical hernia which is nontender and nondiscolored  Neurological: She is alert.  Skin: Skin is warm and dry. Capillary refill takes more than 3 seconds.  Vitals reviewed.     Results for orders placed or performed during the hospital encounter of 02/02/2018 (from the past 48 hour(s))  Comprehensive  metabolic panel     Status: Abnormal   Collection Time: 02/14/2018  5:37 AM  Result Value Ref Range   Sodium 139 135 - 145 mmol/L   Potassium 3.1 (L) 3.5 - 5.1 mmol/L   Chloride 97 (L) 98 - 111 mmol/L    Comment: Please note change in reference range.   CO2 26 22 - 32 mmol/L   Glucose, Bld 127 (H) 70 - 99 mg/dL    Comment: Please note change in reference range.   BUN 24 (H) 8 - 23 mg/dL    Comment: Please note change in reference range.   Creatinine, Ser 1.30 (H) 0.44 - 1.00 mg/dL   Calcium 8.6 (L) 8.9 - 10.3 mg/dL   Total Protein 5.3 (L) 6.5 - 8.1 g/dL   Albumin 2.7 (L) 3.5 - 5.0 g/dL   AST 73 (H) 15 - 41 U/L   ALT 44 0 - 44 U/L    Comment: Please note change in reference range.   Alkaline Phosphatase 87 38 - 126 U/L   Total Bilirubin 0.8 0.3 - 1.2 mg/dL   GFR calc non Af Amer 36 (L) >60 mL/min   GFR calc Af Amer 41 (L) >60 mL/min    Comment: (NOTE) The eGFR has been calculated using the CKD EPI equation. This calculation has not been validated in all clinical situations. eGFR's persistently <60 mL/min signify possible Chronic Kidney Disease.    Anion gap 16 (H) 5 - 15    Comment: Performed at [facility], [address] [place], [place] [address]  Lipase, blood     Status: None   Collection Time: 02/14/2018  5:37 AM  Result Value Ref Range   Lipase 29 11 - 51 U/L    Comment: Performed at [facility], [address] [place], [place] [address]  Troponin I     Status: Abnormal   Collection Time: 02/13/2018  5:37 AM  Result Value Ref Range   Troponin I 0.29 (HH) <0.03 ng/mL    Comment: CRITICAL RESULT CALLED TO, READ BACK BY AND VERIFIED WITH COLE AMORIELLO AT 3419 ON 7//03/2018 JJB Performed at [facility], [address] [place],  [address]   CBC with Differential     Status: Abnormal   Collection Time: 02/19/2018  5:37 AM  Result Value Ref Range   WBC 14.9 (H) 3.6 - 11.0 K/uL   RBC 4.41 3.80 - 5.20 MIL/uL   Hemoglobin 13.9 12.0 -  16.0 g/dL   HCT 42.1 35.0 - 47.0 %   MCV 95.3 80.0 - 100.0 fL   MCH 31.4 26.0 - 34.0  pg   MCHC 33.0 32.0 - 36.0 g/dL   RDW 15.1 (H) 11.5 - 14.5 %   Platelets 227 150 - 440 K/uL   Neutrophils Relative % 64 %   Neutro Abs 9.5 (H) 1.4 - 6.5 K/uL   Lymphocytes Relative 31 %   Lymphs Abs 4.6 (H) 1.0 - 3.6 K/uL   Monocytes Relative 5 %   Monocytes Absolute 0.7 0.2 - 0.9 K/uL   Eosinophils Relative 0 %   Eosinophils Absolute 0.0 0 - 0.7 K/uL   Basophils Relative 0 %   Basophils Absolute 0.0 0 - 0.1 K/uL    Comment: Performed at [facility], [address] [place], [place] [address]  Lactic acid, plasma     Status: Abnormal   Collection Time: 02/12/2018  5:37 AM  Result Value Ref Range   Lactic Acid, Venous 4.3 (HH) 0.5 - 1.9 mmol/L    Comment: CRITICAL RESULT CALLED TO, READ BACK BY AND VERIFIED WITH  COLE AMORIELLO AT 4008 02/11/2018 SDR Performed at [facility], [address] [place], [place] [address]   Urinalysis, Complete w Microscopic     Status: Abnormal   Collection Time: 02/05/2018  5:54 AM  Result Value Ref Range   Color, Urine YELLOW (A) YELLOW   APPearance CLEAR (A) CLEAR   Specific Gravity, Urine 1.017 1.005 - 1.030   pH 6.0 5.0 - 8.0   Glucose, UA NEGATIVE NEGATIVE mg/dL   Hgb urine dipstick NEGATIVE NEGATIVE   Bilirubin Urine NEGATIVE NEGATIVE   Ketones, ur NEGATIVE NEGATIVE mg/dL   Protein, ur 30 (A) NEGATIVE mg/dL   Nitrite NEGATIVE NEGATIVE   Leukocytes, UA NEGATIVE NEGATIVE   RBC / HPF 0-5 0 - 5 RBC/hpf   WBC, UA 0-5 0 - 5 WBC/hpf   Bacteria, UA NONE SEEN NONE SEEN   Squamous Epithelial / LPF 0-5 0 - 5   Mucus PRESENT     Comment: Performed at [facility], [address] [place], [place] [address]  Lactic acid, plasma     Status: Abnormal   Collection Time: 02/03/2018  7:49 AM  Result Value Ref Range   Lactic Acid, Venous 3.1 (HH) 0.5 - 1.9 mmol/L    Comment: CRITICAL RESULT CALLED TO, READ BACK BY AND VERIFIED WITH MEGAN JONES  AT 6712 ON 02/12/2018 JJB Performed at [facility], [address] [place], [place] [address]   Protime-INR     Status: Abnormal   Collection Time: 01/27/2018  7:49 AM  Result Value Ref Range   Prothrombin Time 16.6 (H) 11.4 - 15.2 seconds   INR 1.35     Comment: Performed at [facility], [place], [place] [address]  APTT     Status: None   Collection Time: 02/03/2018  7:49 AM  Result Value Ref Range   aPTT 31 24 - 36 seconds    Comment: Performed at [facility], [place], [place] [address]   Ct Head Wo Contrast  Result Date: 01/21/2018 CLINICAL DATA:  84 year old female with generalized weakness. Initial encounter. EXAM: CT HEAD WITHOUT CONTRAST TECHNIQUE: Contiguous axial images were obtained from the base of the skull through the vertex without intravenous contrast. COMPARISON:  None. FINDINGS: Brain: No intracranial hemorrhage. 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 Prominent chronic microvascular changes. Moderate global atrophy. Ventricular prominence probably related to atrophy rather than hydrocephalus. Patent aqueduct. No intracranial mass lesion noted on this unenhanced exam. Vascular: Vascular calcifications. Skull: Negative  Sinuses/Orbits: No acute orbital abnormality. Visualized paranasal sinuses clear. Other: Minimal partial opacification mastoid air cells. Middle ear cavities clear. 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 Prominent chronic microvascular changes. No intracranial hemorrhage. Global atrophy. Electronically Signed   By: Genia Del M.D.   On: 02/16/2018 07:26   Ct Abdomen Pelvis W Contrast  Result Date: 02/14/2018 CLINICAL DATA:  84 year old female with generalized weakness and abdominal pain. Initial encounter. EXAM: CT ABDOMEN AND PELVIS WITH CONTRAST TECHNIQUE: Multidetector CT imaging of the abdomen and pelvis was performed using the standard protocol following bolus administration of  intravenous contrast. CONTRAST:  58m OMNIPAQUE IOHEXOL 300 MG/ML  SOLN COMPARISON:  10/21/2008 CT abdomen and pelvis. FINDINGS: Lower chest: Small left-sided pleural effusion. Mild basilar atelectasis. Mild cardiomegaly. Possible increased right heart pressure with prominent hepatic veins. Hepatobiliary: 6 mm low-density structure abdominal liver possibly a cyst. Streak artifact without worrisome hepatic lesion noted. Dilated gallbladder. No calcified stones or obvious inflammation. Prominent common bile duct probably related to patient's age. No calcified common bile duct stone or obstructing lesion. Pancreas: No worrisome pancreatic mass or primary pancreatic inflammatory process. Spleen: No splenic mass or enlargement. Adrenals/Urinary Tract: No obstructing stone or hydronephrosis. Delayed images without significant excretion raising possibility poor renal function. Right lower pole 1.2 cm cyst. Streak artifact without worrisome renal lesion noted. No adrenal mass. Noncontrast filled urinary bladder without worrisome abnormality. Stomach/Bowel: Diffuse colonic wall thickening. Less notable small bowel wall thickening. Findings consistent with enterocolitis (more notable involving the colon). Etiology indeterminate possibly ischemic in origin given atherosclerotic changes. Infectious process secondary consideration. Scattered diverticula most notable sigmoid colon. Circumferential thickening distal esophagus may be related to under distension. Fluid-filled stomach. Streak artifact duodenal sweep without primary inflammatory process identified. Duodenal diverticula incidentally noted. No pneumatosis identified. Vascular/Lymphatic: Atherosclerotic changes aorta and aortic branch vessels. Mild ectasia abdominal aorta without aortic aneurysm. Narrowing proximal celiac artery. Narrowing proximal superior mesenteric artery with distal branch occlusion. Narrowing origin inferior mesenteric artery. Scattered normal size  lymph nodes without adenopathy. Reproductive: Post hysterectomy.  No worrisome adnexal mass. Other: Mild third spacing of fluid. No free intraperitoneal air or portal gas. Small fat containing periumbilical hernia. Musculoskeletal: Scoliosis lumbar spine with superimposed degenerative changes. Bilateral hip joint and sacroiliac joint degenerative changes. IMPRESSION: Diffuse colonic wall thickening. Less notable small bowel wall thickening. Findings consistent with enterocolitis (more notable involving the colon). Etiology indeterminate possibly ischemic in origin given atherosclerotic changes. Infectious process secondary consideration. Narrowing proximal superior mesenteric artery with distal branch occlusion. Narrowing origin inferior mesenteric artery. Narrowing proximal celiac artery. Aortic Atherosclerosis (ICD10-I70.0). Small left-sided pleural effusion. Possible increased right heart pressures indicated by prominent hepatic veins. Delayed excretion of contrast into kidneys may indicate poor renal function. Scoliosis lumbar spine with superimposed degenerative changes. These results were called by telephone at the time of interpretation on 02/03/2018 at 7:13 am to Dr. NDarel Hong, who verbally acknowledged these results. Electronically Signed   By: SGenia DelM.D.   On: 02/12/2018 07:22   Dg Chest Port 1 View  Result Date: 02/04/2018 CLINICAL DATA:  Weakness and shortness of breath. EXAM: PORTABLE CHEST 1 VIEW COMPARISON:  Chest radiograph 07/01/2013. Included portion from abdominal CT obtained same day. FINDINGS: Heart is enlarged. Atherosclerosis of the aortic arch. Left pleural effusion basilar atelectasis. No focal airspace disease in the right lung. No pulmonary edema. No pneumothorax. No acute osseous abnormalities. IMPRESSION: Cardiomegaly. Small left pleural effusion with adjacent atelectasis. Aortic Atherosclerosis (ICD10-I70.0). Electronically Signed   By:  Jeb Levering M.D.   On:  01/21/2018 06:58    Assessment/Plan:  CT scan and labs are personally reviewed.  There is some component of colitis and with the history of dehydration and acute onset this suggest ischemic bowel.  Her bicarb is normal but she has an elevated lactic acid.  I discussed with the patient's daughter and family members these findings as well as discussion with Dr. Mable Paris in the ED.  My suspicion is that this patient may benefit from enhanced volume with her atherosclerosis and history of dehydration.  The only other alternative would be surgical intervention which might result in a total colectomy with ileostomy.  This was discussed with the family and the high likelihood that she would not survive an operation such as this was discussed.  Her troponins are elevated as well.  They had already decided that she was DO NOT INTUBATE and stated that they would not consent to surgery in this patient at this time.  With that in mind I will be available as needed if conditions change or reconsultation as needed.  Approximately 60 minutes were spent reviewing the chart and discussing with family and other physicians.  Florene Glen, MD, FACS

## 2018-01-29 NOTE — ED Notes (Signed)
Date and time results received: 01/22/2018 8:58 AM  (use smartphrase ".now" to insert current time)  Test: Lactic Critical Value: 3.1  Name of Provider Notified: Dr. Luberta MutterKonidena  Orders Received? Or Actions Taken?: Critical results acknowledged

## 2018-01-29 NOTE — H&P (Signed)
[facility] [place] at [facility]   PATIENT NAME: Rachel Knox    MR#:  [id number]  DATE OF BIRTH:  05-20-1928  DATE OF ADMISSION:  02/19/2018  PRIMARY CARE PHYSICIAN: Tower, Audrie Gallus, MD   REQUESTING/REFERRING PHYSICIAN: Rifenbark  CHIEF COMPLAINT:   Chief Complaint  Patient presents with  . Abdominal Pain    HISTORY OF PRESENT ILLNESS:  Cortney Beissel  is a 84 y.o. female with a known history of thyroidism, hypertension brought in by family because of son for dehydration, according to patient's daughter going poorly for last 2 weeks with decreased p.o. intake, patient mainly in the bed for last 2 weeks.  Patient complained of severe abdominal pain this morning associated with multiple episodes of vomiting concerning this patient's daughter brought her.  Patient noted to have severe sepsis with elevated lactic acid up to 4, CT abdomen, pelvis with contrast showed diffuse colonic wall thickening, enterocolitis, possible ischemic colitis, narrowing of mesenteric artery and proximal region, narrowing of proximal celiac artery.  Patient seen by Dr. Excell Seltzer, patient likely has ischemic colitis because she is also found to be in A. fib in the emergency room.  Patient has no prior history of atrial fibrillation.  She had already received 1500 mL of NS for, still tachycardic with heart rate 110 bpm, hypoxic with O2 sats 89 to 90% on 4 L.  Patient daughter is at bedside discussed about possible mesenteric ischemia, she said she do not want intubation or resuscitation but agreed with medicines, intensive care admission.  Patient told me that she still has severe abdominal pain around the navel area and has been vomiting.  According to patient's daughter she was working until age 32 and she is pretty active but last 2 weeks she is going downhill/Dr. Milinda Antis her PCP in February reportedly everything was normal. PAST MEDICAL HISTORY:   Past Medical History:  Diagnosis Date  .  Diverticulosis of colon   . Hyperlipidemia   . Hypertension   . IBS (irritable bowel syndrome)   . Osteoarthritis   . Urinary incontinence     PAST SURGICAL HISTOIRY:   Past Surgical History:  Procedure Laterality Date  . ABDOMINAL HYSTERECTOMY    . APPENDECTOMY     with infection (surgery x2)  . BLADDER SURGERY     bladder tack  . BREAST CYST EXCISION  08/2002   breast discharge, cyst removed  . BREAST SURGERY     breast cyst needle biopsy  . FOOT NEUROMA SURGERY    . SHOULDER SURGERY     spur  . TONSILLECTOMY      SOCIAL HISTORY:   Social History   Tobacco Use  . Smoking status: Former Games developer  . Smokeless tobacco: Never Used  Substance Use Topics  . Alcohol use: No    Alcohol/week: 0.0 oz    FAMILY HISTORY:   Family History  Problem Relation Age of Onset  . Cancer Mother        kidney cancer  . Heart disease Father        MI  . Cancer Sister        lung  . Heart disease Sister        MI    DRUG ALLERGIES:   Allergies  Allergen Reactions  . Amlodipine Besylate     REACTION: swelling  . Aricept [Donepezil Hcl] Diarrhea  . Atorvastatin     REACTION: unspecified  . Azithromycin     REACTION: nausea and vomiting  .  Clarithromycin     REACTION: severe diarrhea  . Ibuprofen     REACTION: unspecified  . Levofloxacin     REACTION: diarrhea  . Piroxicam     REACTION: unspecified  . Simvastatin     Cramps in her feet  . Metoprolol     Asymptomatic but severe bradycardia    REVIEW OF SYSTEMS:  Review of systems unobtainable patient able to tell that she has severe abdominal pain level area.  But she is unable to tell any other complaints secondary to her severe abdominal pain and review of systems not possible because she was having severe abdominal pain and she looks uncomfortable.  And the history obtained from patient's daughter.  MEDICATIONS AT HOME:   Prior to Admission medications   Medication Sig Start Date End Date Taking? Authorizing  Provider  levothyroxine (SYNTHROID, LEVOTHROID) 50 MCG tablet Take 1 tablet (50 mcg total) by mouth daily. 09/10/17  Yes Tower, Audrie GallusMarne A, MD  losartan-hydrochlorothiazide (HYZAAR) 100-12.5 MG tablet Take 1 tablet by mouth daily. 09/10/17  Yes Tower, Audrie GallusMarne A, MD  traMADol (ULTRAM) 50 MG tablet TAKE 1 TO 2 TABLETS BY MOUTH EVERY 8 HOURS AS NEEDED FOR PAIN Patient not taking: Reported on 01/31/2018 08/13/17   Tower, Audrie GallusMarne A, MD      VITAL SIGNS:  Blood pressure 133/63, pulse (!) 106, temperature 97.6 F (36.4 C), temperature source Rectal, resp. rate (!) 23, height 5\' 5"  (1.651 m), weight 52.8 kg (116 lb 8 oz), last menstrual period 07/24/1980, SpO2 93 %.  PHYSICAL EXAMINATION:  GENERAL:  84 y.o.-year-old patient lying with distress secondary to severe abdominal pain, she appears severely dehydrated eYES: Pupils equal, round, reactive to light and accommodation. No scleral icterus. Extraocular muscles intact.  HEENT: Head atraumatic, normocephalic. Oropharynx and nasopharynx clear.  NECK:  Supple, no jugular venous distention. No thyroid enlargement, no tenderness.  LUNGS:  diminished air entry bilaterally cARDIOVASCULAR: S1, S2 irregular, rate high  aBDOMEN: Sabdomen distended with generalized tenderness, no rebound tenderness, patient bowel sounds diminished   EXTREMITIES: No pedal edema, cyanosis, or clubbing.  Patient noted to have decreased circulation especially left foot is cold to touch NEUROLOGIC: , awake, oriented.  Patient has severe abdominal pain unable to do full neurologic exam because she is severe abdominal pain and uncomfortable because of abdominal pain  pSYCHIATRIC: The patient is alert and oriented x 3.  SKIN: No obvious rash, lesion, or ulcer.   LABORATORY PANEL:   CBC Recent Labs  Lab 02/01/2018 0537  WBC 14.9*  HGB 13.9  HCT 42.1  PLT 227   ------------------------------------------------------------------------------------------------------------------  Chemistries   Recent Labs  Lab 02/16/2018 0537  NA 139  K 3.1*  CL 97*  CO2 26  GLUCOSE 127*  BUN 24*  CREATININE 1.30*  CALCIUM 8.6*  AST 73*  ALT 44  ALKPHOS 87  BILITOT 0.8   ------------------------------------------------------------------------------------------------------------------  Cardiac Enzymes Recent Labs  Lab 02/03/2018 0537  TROPONINI 0.29*   ------------------------------------------------------------------------------------------------------------------  RADIOLOGY:  Ct Head Wo Contrast  Result Date: 02/02/2018 CLINICAL DATA:  84 year old female with generalized weakness. Initial encounter. EXAM: CT HEAD WITHOUT CONTRAST TECHNIQUE: Contiguous axial images were obtained from the base of the skull through the vertex without intravenous contrast. COMPARISON:  None. FINDINGS: Brain: No intracranial hemorrhage. 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 Prominent chronic microvascular changes. Moderate global atrophy. Ventricular prominence probably related to atrophy rather than hydrocephalus. Patent aqueduct. No intracranial mass lesion noted on this unenhanced exam. Vascular: Vascular calcifications. Skull: Negative Sinuses/Orbits:  No acute orbital abnormality. Visualized paranasal sinuses clear. Other: Minimal partial opacification mastoid air cells. Middle ear cavities clear. 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 Prominent chronic microvascular changes. No intracranial hemorrhage. Global atrophy. Electronically Signed   By: Lacy Duverney M.D.   On: 02/09/2018 07:26   Ct Abdomen Pelvis W Contrast  Result Date: 02/07/2018 CLINICAL DATA:  84 year old female with generalized weakness and abdominal pain. Initial encounter. EXAM: CT ABDOMEN AND PELVIS WITH CONTRAST TECHNIQUE: Multidetector CT imaging of the abdomen and pelvis was performed using the standard protocol following bolus administration of intravenous contrast. CONTRAST:  75mL OMNIPAQUE IOHEXOL 300 MG/ML  SOLN  COMPARISON:  10/21/2008 CT abdomen and pelvis. FINDINGS: Lower chest: Small left-sided pleural effusion. Mild basilar atelectasis. Mild cardiomegaly. Possible increased right heart pressure with prominent hepatic veins. Hepatobiliary: 6 mm low-density structure abdominal liver possibly a cyst. Streak artifact without worrisome hepatic lesion noted. Dilated gallbladder. No calcified stones or obvious inflammation. Prominent common bile duct probably related to patient's age. No calcified common bile duct stone or obstructing lesion. Pancreas: No worrisome pancreatic mass or primary pancreatic inflammatory process. Spleen: No splenic mass or enlargement. Adrenals/Urinary Tract: No obstructing stone or hydronephrosis. Delayed images without significant excretion raising possibility poor renal function. Right lower pole 1.2 cm cyst. Streak artifact without worrisome renal lesion noted. No adrenal mass. Noncontrast filled urinary bladder without worrisome abnormality. Stomach/Bowel: Diffuse colonic wall thickening. Less notable small bowel wall thickening. Findings consistent with enterocolitis (more notable involving the colon). Etiology indeterminate possibly ischemic in origin given atherosclerotic changes. Infectious process secondary consideration. Scattered diverticula most notable sigmoid colon. Circumferential thickening distal esophagus may be related to under distension. Fluid-filled stomach. Streak artifact duodenal sweep without primary inflammatory process identified. Duodenal diverticula incidentally noted. No pneumatosis identified. Vascular/Lymphatic: Atherosclerotic changes aorta and aortic branch vessels. Mild ectasia abdominal aorta without aortic aneurysm. Narrowing proximal celiac artery. Narrowing proximal superior mesenteric artery with distal branch occlusion. Narrowing origin inferior mesenteric artery. Scattered normal size lymph nodes without adenopathy. Reproductive: Post hysterectomy.  No  worrisome adnexal mass. Other: Mild third spacing of fluid. No free intraperitoneal air or portal gas. Small fat containing periumbilical hernia. Musculoskeletal: Scoliosis lumbar spine with superimposed degenerative changes. Bilateral hip joint and sacroiliac joint degenerative changes. IMPRESSION: Diffuse colonic wall thickening. Less notable small bowel wall thickening. Findings consistent with enterocolitis (more notable involving the colon). Etiology indeterminate possibly ischemic in origin given atherosclerotic changes. Infectious process secondary consideration. Narrowing proximal superior mesenteric artery with distal branch occlusion. Narrowing origin inferior mesenteric artery. Narrowing proximal celiac artery. Aortic Atherosclerosis (ICD10-I70.0). Small left-sided pleural effusion. Possible increased right heart pressures indicated by prominent hepatic veins. Delayed excretion of contrast into kidneys may indicate poor renal function. Scoliosis lumbar spine with superimposed degenerative changes. These results were called by telephone at the time of interpretation on 01/25/2018 at 7:13 am to Dr. Merrily Brittle , who verbally acknowledged these results. Electronically Signed   By: Lacy Duverney M.D.   On: 01/27/2018 07:22   Dg Chest Port 1 View  Result Date: 02/20/2018 CLINICAL DATA:  Weakness and shortness of breath. EXAM: PORTABLE CHEST 1 VIEW COMPARISON:  Chest radiograph 07/01/2013. Included portion from abdominal CT obtained same day. FINDINGS: Heart is enlarged. Atherosclerosis of the aortic arch. Left pleural effusion basilar atelectasis. No focal airspace disease in the right lung. No pulmonary edema. No pneumothorax. No acute osseous abnormalities. IMPRESSION: Cardiomegaly. Small left pleural effusion with adjacent atelectasis. Aortic Atherosclerosis (ICD10-I70.0). Electronically Signed   By: Shawna Orleans  Ehinger M.D.   On: 2018/02/16 06:58    EKG:   Orders placed or performed during the  hospital encounter of Feb 16, 2018  . ED EKG  . ED EKG  . EKG 12-Lead  . EKG 12-Lead   EKG shows atrial fibrillation with 133 bpm,  IMPRESSION AND PLAN:   83 year old female patient of hypertension, hypothyroidism not feeling well for the past 2 weeks with decreased p.o. intake and noted to have severe abdominal pain and nausea today morning and found to have atrial fibrillation, mesenteric ischemia on CT abdomen and found to have severe sepsis with elevated lactic acid, now she is hypoxic with O2 sats hovering around 88 to 90% on 4 L and she is also in A. fib, discussed extensively with patient's daughter.she is high risk for mortality due to possible ischemia due to atrial fibrillation she is okay with intensive care unit admission, do not want intubation or resuscitation, spoke with Dr. Excell Seltzer who suggested aggressive volume resuscitation #1 severe abdominal pain secondary to acute mesenteric ischemia, severe colitis: Patient will be admitted to intensive care unit for further aggressive fluid resuscitation, seen by Dr. Excell Seltzer patient is at high risk for surgery, started on thrombolytics. 2.  Atrial fibrillation with RVR new onset u known how long she has A. fib.  Continue aggressive resuscitation, at this time heart rate is around 10 4-1 10 so continue full dose anticoagulation with heparin drip family understand that she may not survive due to her severe sepsis with mesenteric ischemia. 3.  Severe sepsis secondary to intra-abdominal sepsis from mesenteric ischemia patient received Zosyn, continue aggressive antibiotics, IV fluids Acute arterial occlusion, no circulation and no pulse felt in both dorsalis pedis now.  All the records are reviewed and case discussed with ED provider. Management plans discussed with the patient, family and they are in agreement.  CODE STATUS: DNR  TOTAL TIME TAKING CARE OF THIS PATIENT: 55 minutes.  Condition really critical and high risk for cardiac arrest,  discussed extensively with patient's daughter and also patient's son at bedside they understand.  Katha Hamming M.D on 2018-02-16 at 9:16 AM  Between 7am to 6pm - Pager - [phone]  After 6pm go to [email or web address] - password [email or web address] [facility]  [facility] [place] [facility]  Office  [phone]  CC: Primary care physician; Tower, Audrie Gallus, MD  Note: This dictation was prepared with Dragon dictation along with smaller phrase technology. Any transcriptional errors that result from this process are unintentional.

## 2018-01-29 NOTE — ED Provider Notes (Addendum)
[facility] Emergency Department Provider Note  ____________________________________________   First MD Initiated Contact with Patient 02/16/2018 [phone]     (approximate)  I have reviewed the triage vital signs and the nursing notes.   HISTORY  Chief Complaint Abdominal Pain  Level 5 exemption history limited by the patient's clinical condition  HPI ERIEL DUNCKEL is a 84 y.o. female who is brought to the emergency department by her daughter for abdominal pain, nausea, vomiting, and altered mental status.   Apparently the patient has been increasingly confused and not behaving normally for the past 2 weeks or so however this morning woke up suddenly and had severe abdominal discomfort, nausea, vomiting, and is not behaving normally.  The patient's daughter thinks that the patient has had multiple abdominal surgeries although is not entirely sure what they are.  She thinks she only takes a "medicine for acid reflux" and is not sure any other medical history aside from dementia.  Her daughter at bedside is her power of attorney and emphasizes to me the patient would never want to be on mechanical ventilation.   Past Medical History:  Diagnosis Date  . Diverticulosis of colon   . Hyperlipidemia   . Hypertension   . IBS (irritable bowel syndrome)   . Osteoarthritis   . Urinary incontinence     Patient Active Problem List   Diagnosis Date Noted  . Mixed incontinence 02/07/2017  . Hypothyroidism 01/05/2017  . Thoracic back pain 03/20/2016  . Abnormal urinalysis 03/20/2016  . Allergic rhinitis, seasonal 03/20/2016  . B12 deficiency 04/30/2015  . Dementia 01/29/2015  . Memory difficulties 06/27/2013  . Loss of weight 06/27/2013  . Stress reaction 06/27/2013  . Other screening mammogram 10/12/2010  . Osteoarthritis 10/12/2010  . Preventative health care 10/12/2010  . ABNORMAL FINDINGS GI TRACT 12/10/2008  . HYPERCHOLESTEROLEMIA, PURE 02/01/2007  .  Essential hypertension 02/01/2007  . ESOPHAGEAL STRICTURE 01/22/2007  . IBS 01/22/2007  . Lumbar disc disease 01/22/2007    Past Surgical History:  Procedure Laterality Date  . ABDOMINAL HYSTERECTOMY    . APPENDECTOMY     with infection (surgery x2)  . BLADDER SURGERY     bladder tack  . BREAST CYST EXCISION  08/2002   breast discharge, cyst removed  . BREAST SURGERY     breast cyst needle biopsy  . FOOT NEUROMA SURGERY    . SHOULDER SURGERY     spur  . TONSILLECTOMY      Prior to Admission medications   Medication Sig Start Date End Date Taking? Authorizing Provider  levothyroxine (SYNTHROID, LEVOTHROID) 50 MCG tablet Take 1 tablet (50 mcg total) by mouth daily. 09/10/17   Tower, Audrie Gallus, MD  losartan-hydrochlorothiazide (HYZAAR) 100-12.5 MG tablet Take 1 tablet by mouth daily. 09/10/17   Tower, Audrie Gallus, MD  traMADol (ULTRAM) 50 MG tablet TAKE 1 TO 2 TABLETS BY MOUTH EVERY 8 HOURS AS NEEDED FOR PAIN 08/13/17   Tower, Audrie Gallus, MD    Allergies Amlodipine besylate; Aricept [donepezil hcl]; Atorvastatin; Azithromycin; Clarithromycin; Ibuprofen; Levofloxacin; Piroxicam; Simvastatin; and Metoprolol  Family History  Problem Relation Age of Onset  . Cancer Mother        kidney cancer  . Heart disease Father        MI  . Cancer Sister        lung  . Heart disease Sister        MI    Social History Social History   Tobacco Use  .  Smoking status: Former Games developer  . Smokeless tobacco: Never Used  Substance Use Topics  . Alcohol use: No    Alcohol/week: 0.0 oz  . Drug use: No    Review of Systems Level 5 exemption history limited by the patient's critical illness  ____________________________________________   PHYSICAL EXAM:  VITAL SIGNS: ED Triage Vitals  Enc Vitals Group     BP      Pulse      Resp      Temp      Temp src      SpO2      Weight      Height      Head Circumference      Peak Flow      Pain Score      Pain Loc      Pain Edu?      Excl. in  GC?     Constitutional: Appears critically ill moaning in pain and obtunded Eyes: PERRL EOMI. midrange and sluggish Head: Atraumatic. Nose: No congestion/rhinnorhea. Mouth/Throat: No trismus Neck: No stridor.   Cardiovascular: Irregularly irregular and tachycardic Respiratory: Increased respiratory effort.  No retractions. Lungs CTAB and moving good air Gastrointestinal: Quite tender diffusely.  She does have an umbilical hernia with no skin changes that is easy to reduce. Musculoskeletal: No lower extremity edema   Neurologic: Moves all 4 Skin:  Skin is warm, dry and intact. No rash noted. Psychiatric: Obtunded    ____________________________________________   DIFFERENTIAL includes but not limited to  Mesenteric ischemia, bowel obstruction, sepsis, perforation, urinary tract infection ____________________________________________   LABS (all labs ordered are listed, but only abnormal results are displayed)  Labs Reviewed  COMPREHENSIVE METABOLIC PANEL - Abnormal; Notable for the following components:      Result Value   Potassium 3.1 (*)    Chloride 97 (*)    Glucose, Bld 127 (*)    BUN 24 (*)    Creatinine, Ser 1.30 (*)    Calcium 8.6 (*)    Total Protein 5.3 (*)    Albumin 2.7 (*)    AST 73 (*)    GFR calc non Af Amer 36 (*)    GFR calc Af Amer 41 (*)    Anion gap 16 (*)    All other components within normal limits  TROPONIN I - Abnormal; Notable for the following components:   Troponin I 0.29 (*)    All other components within normal limits  CBC WITH DIFFERENTIAL/PLATELET - Abnormal; Notable for the following components:   WBC 14.9 (*)    RDW 15.1 (*)    Neutro Abs 9.5 (*)    Lymphs Abs 4.6 (*)    All other components within normal limits  LACTIC ACID, PLASMA - Abnormal; Notable for the following components:   Lactic Acid, Venous 4.3 (*)    All other components within normal limits  URINALYSIS, COMPLETE (UACMP) WITH MICROSCOPIC - Abnormal; Notable for the  following components:   Color, Urine YELLOW (*)    APPearance CLEAR (*)    Protein, ur 30 (*)    All other components within normal limits  CULTURE, BLOOD (ROUTINE X 2)  CULTURE, BLOOD (ROUTINE X 2)  LIPASE, BLOOD  LACTIC ACID, PLASMA    Lab work reviewed by me with a number of abnormalities most notably an elevated lactic acid and elevated troponin.  Lactic acid could be secondary to bowel ischemia versus sepsis.  The troponin is almost assuredly secondary to demand __________________________________________  EKG  ED ECG REPORT I, Merrily BrittleNeil Rosita Guzzetta, the attending physician, personally viewed and interpreted this ECG.  Date: 2018/04/28 EKG Time:  Rate: 133 Rhythm: Atrial fibrillation with rapid ventricular response QRS Axis: Leftward axis Intervals: normal ST/T Wave abnormalities: Diffuse ST depression likely secondary to demand Narrative Interpretation: no evidence of acute ischemia  ____________________________________________  RADIOLOGY  CT abdomen pelvis reviewed by me with diffuse colitis concerning for mesenteric ischemia CT scan of the head reviewed by me with no acute disease Chest x-ray reviewed by me with no acute disease ____________________________________________   PROCEDURES  Procedure(s) performed: no  .Critical Care Performed by: Merrily Brittleifenbark, Diasia Henken, MD Authorized by: Merrily Brittleifenbark, Wynston Romey, MD   Critical care provider statement:    Critical care time (minutes):  40   Critical care time was exclusive of:  Separately billable procedures and treating other patients   Critical care was necessary to treat or prevent imminent or life-threatening deterioration of the following conditions:  Sepsis   Critical care was time spent personally by me on the following activities:  Development of treatment plan with patient or surrogate, discussions with consultants, evaluation of patient's response to treatment, examination of patient, obtaining history from patient or  surrogate, ordering and performing treatments and interventions, ordering and review of laboratory studies, ordering and review of radiographic studies, pulse oximetry, re-evaluation of patient's condition and review of old charts    Critical Care performed: Yes  ____________________________________________   INITIAL IMPRESSION / ASSESSMENT AND PLAN / ED COURSE  Pertinent labs & imaging results that were available during my care of the patient were reviewed by me and considered in my medical decision making (see chart for details).   On arrival the patient is in atrial fibrillation with rapid ventricular response, altered, confused, with significant abdominal pain.  Differential is extremely broad but she requires labs including a lactic acid as well as a CT scan with IV contrast.     ----------------------------------------- 6:52 AM on 2018/04/28 ----------------------------------------- I was notified that the patient's lactic acid was 4.3 which along with new onset atrial fibrillation raises concern for mesenteric ischemia versus another intra-abdominal catastrophe such as strangulated hernia versus a severe infection.  I spoke with the CT tech and we sent the patient to the scanner prior to creatinine coming back because of the critical nature of the patient's illness.  [facility] read.  Zosyn and another 500 cc of normal saline IV bolus now.  I had a lengthy discussion regarding the critical nature of the patient with her daughter at bedside and the daughter who is emphasized to me that the patient would never want to be on mechanical ventilation and as such I will be slightly more cautious with fluids so as not to cause pulmonary edema.  At this point the elevated lactic acid is not necessarily from sepsis so I do not believe she requires 30 cc/kg of  fluids.  ____________________________________________  ----------------------------------------- 7:13 AM on 2018/04/28 -----------------------------------------  I was called by radiology that the patient has severe diffuse colitis and enteritis of ischemic versus infectious etiology.  It does appear that she has significant narrowing of the SMA and likely ischemia secondary to this acute narrowing.  I have a call out to general surgery now.   ----------------------------------------- 7:33 AM on 2018/04/28 -----------------------------------------  I spoke with Dr. Excell Seltzerooper of general surgery who agrees with IV antibiotics and he recommends fluid resuscitation and to discuss with vascular surgery regarding heparinization but  the patient likely would benefit from heparinization.  He said that given the significant disease the patient would likely die from operative intervention.  I discussed with the patient and with her daughter at bedside who agree they do not want surgery and we will treat her medically at this point.  They want the patient to be DO NOT INTUBATE and DO NOT RESUSCITATE.      FINAL CLINICAL IMPRESSION(S) / ED DIAGNOSES  Final diagnoses:  Lactic acidosis  Elevated troponin  Encephalopathy  Mesenteric ischemia (HCC)  Colitis      NEW MEDICATIONS STARTED DURING THIS VISIT:  New Prescriptions   No medications on file     Note:  This document was prepared using Dragon voice recognition software and may include unintentional dictation errors.     Merrily Brittle, MD 02/09/2018 [id number]    Merrily Brittle, MD 02/01/[id number]

## 2018-01-29 NOTE — Consult Note (Signed)
[facility] Vascular Consult Note  MRN : [id number]  [name] is a 84 y.o. ([birth year]) female who presents with chief complaint of  Chief Complaint  Patient presents with  . Abdominal Pain  .  History of Present Illness:   I am asked to see the patient by Dr. Belia Knox for mesenteric ischemia.  The patient is an 84 year old woman who was admitted to [facility] after a 2-week history of failure to thrive and poor p.o. Intake.  In spite of the family's attempts to care for her at home this morning she experienced the abrupt onset of abdominal pain.  The patient is a poor historian secondary to chronic dementia but in discussion with the family it is felt to be 10 out of 10 or excruciating.  There are no alleviating factors.  He was also noted to be in atrial fibrillation in the emergency room.  CT scan was obtained which shows an embolism in the SMA and possible stenosis of the proximal superior mesenteric artery.  Current Facility-Administered Medications  Medication Dose Route Frequency Provider Last Rate Last Dose  . [MAR Hold] acetaminophen (TYLENOL) tablet 650 mg  650 mg Oral Q6H PRN Rachel Knox, Snehalatha, MD       Or  . Rachel Knox[MAR Hold] acetaminophen (TYLENOL) suppository 650 mg  650 mg Rectal Q6H PRN Rachel Knox, Snehalatha, MD      . Rachel Knox[MAR Hold] bisacodyl (DULCOLAX) EC tablet 5 mg  5 mg Oral Daily PRN Rachel Knox, Snehalatha, MD      . heparin ADULT infusion 100 units/mL (25000 units/21650mL sodium chloride 0.45%)  850 Units/hr Intravenous Continuous Rachel FullingKasa, Kurian, MD 8.5 mL/hr at 02/11/2018 1227 850 Units/hr at 01/22/2018 1227  . lactated ringers 1,000 mL with potassium chloride 30 mEq infusion   Intravenous Continuous Rachel FullingKasa, Kurian, MD 100 mL/hr at 01/28/2018 1539    . [MAR Hold] morphine 2 MG/ML injection 2 mg  2 mg Intravenous Q30 min PRN Rachel FullingKasa, Kurian, MD   2 mg at 01/25/2018 1541  . [MAR Hold] ondansetron (ZOFRAN) tablet 4 mg  4 mg Oral Q6H PRN Rachel Knox,  Snehalatha, MD       Or  . Rachel Knox[MAR Hold] ondansetron (ZOFRAN) injection 4 mg  4 mg Intravenous Q6H PRN Rachel Knox, Snehalatha, MD      . Rachel Knox[MAR Hold] piperacillin-tazobactam (ZOSYN) IVPB 3.375 g  3.375 g Intravenous Q8H Rachel Knox, Snehalatha, MD 100 mL/hr at 02/03/2018 1338 3.375 g at 02/04/2018 1338    Past Medical History:  Diagnosis Date  . Diverticulosis of colon   . Hyperlipidemia   . Hypertension   . IBS (irritable bowel syndrome)   . Osteoarthritis   . Urinary incontinence     Past Surgical History:  Procedure Laterality Date  . ABDOMINAL HYSTERECTOMY    . APPENDECTOMY     with infection (surgery x2)  . BLADDER SURGERY     bladder tack  . BREAST CYST EXCISION  08/2002   breast discharge, cyst removed  . BREAST SURGERY     breast cyst needle biopsy  . FOOT NEUROMA SURGERY    . SHOULDER SURGERY     spur  . TONSILLECTOMY      Social History Social History   Tobacco Use  . Smoking status: Former Games developermoker  . Smokeless tobacco: Never Used  Substance Use Topics  . Alcohol use: No    Alcohol/week: 0.0 oz  . Drug use: No    Family History Family History  Problem Relation Age of Onset  .  Cancer Mother        kidney cancer  . Heart disease Father        MI  . Cancer Sister        lung  . Heart disease Sister        MI  No family history of bleeding/clotting disorders, porphyria or autoimmune disease   Allergies  Allergen Reactions  . Amlodipine Besylate     REACTION: swelling  . Aricept [Donepezil Hcl] Diarrhea  . Atorvastatin     REACTION: unspecified  . Azithromycin     REACTION: nausea and vomiting  . Clarithromycin     REACTION: severe diarrhea  . Ibuprofen     REACTION: unspecified  . Levofloxacin     REACTION: diarrhea  . Piroxicam     REACTION: unspecified  . Simvastatin     Cramps in her feet  . Metoprolol     Asymptomatic but severe bradycardia     REVIEW OF SYSTEMS (given the patient's dementia already making her minimally verbal and a poor  historian as well as the pain medications she has been receiving she is not able to communicate at this time therefore review of systems cannot be performed.)   Physical Examination  Vitals:   02/14/2018 1200 02/19/2018 1300 02/17/2018 1400 02/20/2018 1618  BP: (!) 126/59 (!) 106/59 129/62 (!) 107/97  Pulse: (!) 118   (!) 114  Resp: (!) 21 16 17  (!) 23  Temp: (!) 97.1 F (36.2 C)   97.8 F (36.6 C)  TempSrc:    Axillary  SpO2: 94%   97%  Weight:      Height:       Body mass index is 19.39 kg/m. Gen:  WD/WN, severe distress patient is writhing in the bed Head: /AT, moderate temporalis wasting.  Ear/Nose/Throat: Hearing unable to assess patient does not respond, nares w/o erythema or drainage, oropharynx poor dentition Mallampati score of class II  Eyes: Sclera non-icteric, conjunctiva clear Neck: Trachea midline.  No JVD.  Pulmonary: Coarse rhonchi bilaterally, respirations not labored, equal bilaterally.  Cardiac: RRR, normal S1, S2. Vascular:  Vessel Right Left  Radial Palpable Palpable  Femoral Palpable Palpable  Gastrointestinal: soft, + tender/mild to moderate distended.  + guarding/reflex.  Musculoskeletal: M/S 5/5 throughout.  Extremities without ischemic changes.  No deformity + atrophy. No edema. Neurologic:  Motor exam as listed above. Psychiatric: Unable to assess. Dermatologic: No rashes or ulcers noted.  No cellulitis or open wounds. Lymph : No Cervical, Axillary, or Inguinal lymphadenopathy.    CBC Lab Results  Component Value Date   WBC 14.9 (H) 02/18/2018   HGB 13.9 01/25/2018   HCT 42.1 02/05/2018   MCV 95.3 02/13/2018   PLT 227 01/28/2018    BMET    Component Value Date/Time   NA 139 02/19/2018 0537   NA 138 02/02/2014 1157   K 3.1 (L) 02/13/2018 0537   K 3.5 02/02/2014 1157   CL 97 (L) 01/26/2018 0537   CL 99 02/02/2014 1157   CO2 26 01/24/2018 0537   CO2 31 02/02/2014 1157   GLUCOSE 127 (H) 01/25/2018 0537   GLUCOSE 88 02/02/2014 1157   BUN  24 (H) 02/11/2018 0537   BUN 19 (H) 02/02/2014 1157   CREATININE 1.30 (H) 01/21/2018 0537   CREATININE 1.17 02/02/2014 1157   CALCIUM 8.6 (L) 02/11/2018 0537   CALCIUM 9.2 02/02/2014 1157   GFRNONAA 36 (L) 01/24/2018 0537   GFRNONAA 43 (L) 02/02/2014 1157   GFRAA  41 (L) 2018-02-04 0537   GFRAA 50 (L) 02/02/2014 1157   Estimated Creatinine Clearance: 24.9 mL/min (A) (by C-G formula based on SCr of 1.3 mg/dL (H)).  COAG Lab Results  Component Value Date   INR 1.35 02/04/2018   INR 1.0 11/16/2012    Radiology Ct Head Wo Contrast  Result Date: 2018-02-04 CLINICAL DATA:  84 year old female with generalized weakness. Initial encounter. EXAM: CT HEAD WITHOUT CONTRAST TECHNIQUE: Contiguous axial images were obtained from the base of the skull through the vertex without intravenous contrast. COMPARISON:  None. FINDINGS: Brain: No intracranial hemorrhage. 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 Prominent chronic microvascular changes. Moderate global atrophy. Ventricular prominence probably related to atrophy rather than hydrocephalus. Patent aqueduct. No intracranial mass lesion noted on this unenhanced exam. Vascular: Vascular calcifications. Skull: Negative Sinuses/Orbits: No acute orbital abnormality. Visualized paranasal sinuses clear. Other: Minimal partial opacification mastoid air cells. Middle ear cavities clear. 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 Prominent chronic microvascular changes. No intracranial hemorrhage. Global atrophy. Electronically Signed   By: Lacy Duverney M.D.   On: 2018-02-04 07:26   Ct Abdomen Pelvis W Contrast  Result Date: 02-04-2018 CLINICAL DATA:  84 year old female with generalized weakness and abdominal pain. Initial encounter. EXAM: CT ABDOMEN AND PELVIS WITH CONTRAST TECHNIQUE: Multidetector CT imaging of the abdomen and pelvis was performed using the standard protocol following bolus administration of intravenous contrast. CONTRAST:  75mL  OMNIPAQUE IOHEXOL 300 MG/ML  SOLN COMPARISON:  10/21/2008 CT abdomen and pelvis. FINDINGS: Lower chest: Small left-sided pleural effusion. Mild basilar atelectasis. Mild cardiomegaly. Possible increased right heart pressure with prominent hepatic veins. Hepatobiliary: 6 mm low-density structure abdominal liver possibly a cyst. Streak artifact without worrisome hepatic lesion noted. Dilated gallbladder. No calcified stones or obvious inflammation. Prominent common bile duct probably related to patient's age. No calcified common bile duct stone or obstructing lesion. Pancreas: No worrisome pancreatic mass or primary pancreatic inflammatory process. Spleen: No splenic mass or enlargement. Adrenals/Urinary Tract: No obstructing stone or hydronephrosis. Delayed images without significant excretion raising possibility poor renal function. Right lower pole 1.2 cm cyst. Streak artifact without worrisome renal lesion noted. No adrenal mass. Noncontrast filled urinary bladder without worrisome abnormality. Stomach/Bowel: Diffuse colonic wall thickening. Less notable small bowel wall thickening. Findings consistent with enterocolitis (more notable involving the colon). Etiology indeterminate possibly ischemic in origin given atherosclerotic changes. Infectious process secondary consideration. Scattered diverticula most notable sigmoid colon. Circumferential thickening distal esophagus may be related to under distension. Fluid-filled stomach. Streak artifact duodenal sweep without primary inflammatory process identified. Duodenal diverticula incidentally noted. No pneumatosis identified. Vascular/Lymphatic: Atherosclerotic changes aorta and aortic branch vessels. Mild ectasia abdominal aorta without aortic aneurysm. Narrowing proximal celiac artery. Narrowing proximal superior mesenteric artery with distal branch occlusion. Narrowing origin inferior mesenteric artery. Scattered normal size lymph nodes without adenopathy.  Reproductive: Post hysterectomy.  No worrisome adnexal mass. Other: Mild third spacing of fluid. No free intraperitoneal air or portal gas. Small fat containing periumbilical hernia. Musculoskeletal: Scoliosis lumbar spine with superimposed degenerative changes. Bilateral hip joint and sacroiliac joint degenerative changes. IMPRESSION: Diffuse colonic wall thickening. Less notable small bowel wall thickening. Findings consistent with enterocolitis (more notable involving the colon). Etiology indeterminate possibly ischemic in origin given atherosclerotic changes. Infectious process secondary consideration. Narrowing proximal superior mesenteric artery with distal branch occlusion. Narrowing origin inferior mesenteric artery. Narrowing proximal celiac artery. Aortic Atherosclerosis (ICD10-I70.0). Small left-sided pleural effusion. Possible increased right heart pressures indicated by prominent hepatic veins. Delayed excretion of contrast into  kidneys may indicate poor renal function. Scoliosis lumbar spine with superimposed degenerative changes. These results were called by telephone at the time of interpretation on 02/06/2018 at 7:13 am to Dr. Merrily Brittle , who verbally acknowledged these results. Electronically Signed   By: Lacy Duverney M.D.   On: 02/16/2018 07:22   Dg Chest Port 1 View  Result Date: 02/10/2018 CLINICAL DATA:  Weakness and shortness of breath. EXAM: PORTABLE CHEST 1 VIEW COMPARISON:  Chest radiograph 07/01/2013. Included portion from abdominal CT obtained same day. FINDINGS: Heart is enlarged. Atherosclerosis of the aortic arch. Left pleural effusion basilar atelectasis. No focal airspace disease in the right lung. No pulmonary edema. No pneumothorax. No acute osseous abnormalities. IMPRESSION: Cardiomegaly. Small left pleural effusion with adjacent atelectasis. Aortic Atherosclerosis (ICD10-I70.0). Electronically Signed   By: Rubye Oaks M.D.   On: 02/03/2018 06:58       Assessment/Plan 1.  Mesenteric ischemia likely secondary to embolization but stenosis proximally cannot be ruled out.  I have personally reviewed the CT scan and this is my interpretation.  I have discussed in detail the options with the patient's daughter who is the power of attorney.  The patient would not tolerate open surgical exploration with resection of marginal bowel.  The other option is interventional to see if reestablishing blood flow will allow for healing of the colitis changes.  I have also discussed with the daughter the risks of anesthesia.  I have also reviewed with the daughter the difficult position that if we do nothing this will certainly lead to the patient's demise however, even if we are successful if there are advanced ischemic changes to the point of patchy gangrene she still may not survive.  The daughter voices a good understanding of this complicated problem and wishes [place] to proceed with angiography and intervention.  The risks and benefits of been reviewed all questions been answered patient's daughter wishes [place] to proceed.  I have also discussed the risks of anesthesia with the daughter and will involve anesthesia as she is moving so much it would be impossible to do her angiogram without anesthesia.  2.  Dementia: This is a chronic problem and likely the issue that led to her lack of p.o. intake which in turn may have been the trigger for her atrial fibrillation.  This is a very poor prognostic sign.  Supportive care will continue.  3.  Atrial fibrillation: I believe that the distal lesion on CT is clearly an embolism.  It is hard to be certain that there is actually a stenosis or stricture proximally.  This very likely could be a pure embolic problem secondary to atrial fibrillation.  She is now having some melena likely secondary from the colitis.  However, heparin will be continued as tolerated.  Rate control is also being achieved with parenteral  medication.   Levora Dredge, MD  02/20/2018 4:26 PM    This note was created with Dragon medical transcription system.  Any error is purely unintentional

## 2018-01-29 NOTE — ED Notes (Signed)
Date and time results received: 2017/12/29 0627  Test: Troponon Critical Value: Trop: 0.29  Name of Provider Notified: Dr Lamont Snowballifenbark

## 2018-01-29 NOTE — Anesthesia Preprocedure Evaluation (Addendum)
Anesthesia Evaluation  Patient identified by MRN, date of birth, ID band Patient awake    Reviewed: Allergy & Precautions, NPO status , Patient's Chart, lab work & pertinent test results, reviewed documented beta blocker date and time   Airway Mallampati: II  TM Distance: >3 FB     Dental  (+) Chipped   Pulmonary former smoker,           Cardiovascular hypertension, Pt. on medications      Neuro/Psych PSYCHIATRIC DISORDERS Anxiety Dementia    GI/Hepatic   Endo/Other  Hypothyroidism   Renal/GU      Musculoskeletal  (+) Arthritis ,   Abdominal   Peds  Hematology   Anesthesia Other Findings Emergency case.  Reproductive/Obstetrics                            Anesthesia Physical Anesthesia Plan  ASA: III  Anesthesia Plan: General   Post-op Pain Management:    Induction: Intravenous  PONV Risk Score and Plan:   Airway Management Planned: LMA  Additional Equipment:   Intra-op Plan:   Post-operative Plan:   Informed Consent: I have reviewed the patients History and Physical, chart, labs and discussed the procedure including the risks, benefits and alternatives for the proposed anesthesia with the patient or authorized representative who has indicated his/her understanding and acceptance.     Plan Discussed with: CRNA  Anesthesia Plan Comments:         Anesthesia Quick Evaluation

## 2018-01-29 NOTE — Progress Notes (Signed)
Visited patient in ICU.  2 sisters at bedside.  Patient describes ongoing abdominal pain.  No further bowel movements.  Vital signs reviewed Patient no longer hypotensive.  Remains afebrile.  Heart rate has gone up from 10 1-1 18 and respiratory rate is gone from 12-21 as well. Abdomen exam is essentially unchanged with minimal percussion tenderness and no rebound tenderness no guarding.  This a patient with possible ischemic bowel that should be reversible based on findings on the CT scan.  Lactic acid has come down to 3.1 with hydration and resuscitation.  Family has decided no surgical intervention would be entertained.  She is currently on a heparin drip and I discussed this with the intensivist.

## 2018-01-29 NOTE — Progress Notes (Addendum)
ANTICOAGULATION CONSULT NOTE - Initial Consult  Pharmacy Consult for Heparin Drip Indication: Mesenteric Ischemia  Allergies  Allergen Reactions  . Amlodipine Besylate     REACTION: swelling  . Aricept [Donepezil Hcl] Diarrhea  . Atorvastatin     REACTION: unspecified  . Azithromycin     REACTION: nausea and vomiting  . Clarithromycin     REACTION: severe diarrhea  . Ibuprofen     REACTION: unspecified  . Levofloxacin     REACTION: diarrhea  . Piroxicam     REACTION: unspecified  . Simvastatin     Cramps in her feet  . Metoprolol     Asymptomatic but severe bradycardia    Patient Measurements: Height: 5\' 5"  (165.1 cm) Weight: 116 lb 8 oz (52.8 kg) IBW/kg (Calculated) : 57 Heparin Dosing Weight: 52.8 kg  Vital Signs: Temp: 97.6 F (36.4 C) (07/09 0536) Temp Source: Rectal (07/09 0536) BP: 126/68 (07/09 0700) Pulse Rate: 119 (07/09 0600)  Labs: Recent Labs    04/19/2018 0537 04/19/2018 0749  HGB 13.9  --   HCT 42.1  --   PLT 227  --   APTT  --  31  LABPROT  --  16.6*  INR  --  1.35  CREATININE 1.30*  --   TROPONINI 0.29*  --     Estimated Creatinine Clearance: 24.9 mL/min (A) (by C-G formula based on SCr of 1.3 mg/dL (H)).   Medical History: Past Medical History:  Diagnosis Date  . Diverticulosis of colon   . Hyperlipidemia   . Hypertension   . IBS (irritable bowel syndrome)   . Osteoarthritis   . Urinary incontinence     Medications:  Scheduled:  . glycopyrrolate  0.1 mg Intravenous Once  . heparin  2,600 Units Intravenous Once   Infusions:  . heparin Stopped (04/19/2018 0756)    Assessment:  84 yo F to start Heparin drip for mesenteric/bowel ischemia. Patient also with Afib per ED MD note.  No anticoagulants PTA per Med Rec hgb 13.9  Plt 227  INR 1.35  APTT 31  Troponin 0.29  Goal of Therapy:  Heparin level 0.3-0.7 units/ml Monitor platelets by anticoagulation protocol: Yes   Plan:  Give 2600 units bolus x 1 Start heparin infusion  at 850 units/hr Check anti-Xa level in 8 hours and daily while on heparin Continue to monitor H&H and platelets  Thedora Rings A 02/07/2018,8:47 AM

## 2018-01-29 NOTE — Progress Notes (Signed)
No vascular consult entered in managed orders. Dr Gilda CreaseSchnier has not received a phone call from Dr Belia HemanKasa. Spoke with Dr. Excell Seltzerooper and stated to place order for consult. Dr Gilda CreaseSchnier will be notified of consult.

## 2018-01-29 NOTE — ED Notes (Signed)
Pt given cup of ice water per okay by Dr Lamont Snowballifenbark

## 2018-01-29 NOTE — ED Notes (Signed)
Dr. Excell Seltzerooper at bedside to speak with family.

## 2018-01-29 NOTE — Progress Notes (Signed)
ANTICOAGULATION CONSULT NOTE - Initial Consult  Pharmacy Consult for Heparin Drip Indication: Mesenteric Ischemia  Allergies  Allergen Reactions  . Amlodipine Besylate     REACTION: swelling  . Aricept [Donepezil Hcl] Diarrhea  . Atorvastatin     REACTION: unspecified  . Azithromycin     REACTION: nausea and vomiting  . Clarithromycin     REACTION: severe diarrhea  . Ibuprofen     REACTION: unspecified  . Levofloxacin     REACTION: diarrhea  . Piroxicam     REACTION: unspecified  . Simvastatin     Cramps in her feet  . Metoprolol     Asymptomatic but severe bradycardia    Patient Measurements: Height: 5\' 5"  (165.1 cm) Weight: 116 lb 8 oz (52.8 kg) IBW/kg (Calculated) : 57 Heparin Dosing Weight: 52.8 kg  Vital Signs: Temp: 97.1 F (36.2 C) (07/09 1200) Temp Source: Rectal (07/09 0536) BP: 129/62 (07/09 1400) Pulse Rate: 118 (07/09 1200)  Labs: Recent Labs    02/01/2018 0537 01/27/2018 0749  HGB 13.9  --   HCT 42.1  --   PLT 227  --   APTT  --  31  LABPROT  --  16.6*  INR  --  1.35  CREATININE 1.30*  --   TROPONINI 0.29*  --     Estimated Creatinine Clearance: 24.9 mL/min (A) (by C-G formula based on SCr of 1.3 mg/dL (H)).   Medical History: Past Medical History:  Diagnosis Date  . Diverticulosis of colon   . Hyperlipidemia   . Hypertension   . IBS (irritable bowel syndrome)   . Osteoarthritis   . Urinary incontinence     Medications:  Scheduled:   Infusions:  . heparin 850 Units/hr (02/10/2018 1227)  . lactated ringers with kcl 100 mL/hr at 02/09/2018 1539  . piperacillin-tazobactam (ZOSYN)  IV 3.375 g (01/27/2018 1338)    Assessment:  84 yo F to start Heparin drip for mesenteric/bowel ischemia. Patient also with Afib per ED MD note.  Goal of Therapy:  Heparin level 0.3-0.7 units/ml Monitor platelets by anticoagulation protocol: Yes   Plan:  Patient received heparin bolus 2600 units at 0946 and Heparin drip was initiated at 0947. Heparin  drip was stopped at 1045. Patient's heparin drip was resumed at 1227. Will obtain heparin level at 2100. There is concern that patient also has a GI Bleed, will defer additional heparin boluses x 24 hours and reevaluate on AM rounds.   Pharmacy will continue to monitor and adjust per consult.    Kassidee Narciso L 01/27/2018,4:05 PM

## 2018-01-29 NOTE — ED Notes (Signed)
Pt noted to be 88% on RA with good reading of O2 sensor, pt placed on 2L via [place], O2 sats 95% after application of O2.

## 2018-01-29 NOTE — Consult Note (Signed)
Name: Rachel Knox MRN: [id number] DOB: 1928/05/12     CONSULTATION DATE: 02/18/2018  REFERRING MD : Dr. Luberta Mutter  CHIEF COMPLAINT:  Mesenteric Ischemia   HISTORY OF PRESENT ILLNESS:  84 year old white female with PMH of hyperlipidemia, hypertension, IBS, and diverticulosis who presented to the emergency department this morning with acute onset of abdominal pain this morning with associated nausea and vomiting. Patient's daughter reports that she felt the patient may have had a virus over the past two weeks. She reports the patient was feeling fatigued with some increased confusion and gradual decrease in appetite, but denies any associated symptoms. Patient does have a history of dementia that was diagnosed in February. Daugther describes the dementia as "mild" and states that her mother has some forgetfullness. She endorses that the patient is mostly independent at baseline, however she does live with her daughter. Over the past 2 weeks she has acquired additional assistance going to the bathroom. Patient's daughter states that she thought her mother was feeling better yesterday, but then woke up this morning in significant pain. Daughter states she attempted to give her mother gas-x, but it caused her to immediately gag. She endorses vomiting "phlegm" ,but denies blood in vomit. No reported history of fevers, cough, diarrhea, C, or hematochezia. Daughter states that her mother is healthy and has no history of cardiovascular conditions, including afib. Daughter endorses that the patient has been avoiding going to the doctor because she is worried she will be sent to the hospital and kept at the hospital. Patient has history of appendectomy, hysterectomy, and bladder surgery. Daughter is POA. Patient is able to tell me that her stomach is bothering her today. History limited due to patient's medical condition.   PAST MEDICAL HISTORY :   has a past medical history of Diverticulosis of colon,  Hyperlipidemia, Hypertension, IBS (irritable bowel syndrome), Osteoarthritis, and Urinary incontinence.  has a past surgical history that includes Appendectomy; Abdominal hysterectomy; Tonsillectomy; Bladder surgery; Foot neuroma surgery; Breast surgery; Breast cyst excision (08/2002); and Shoulder surgery. Prior to Admission medications   Medication Sig Start Date End Date Taking? Authorizing Provider  levothyroxine (SYNTHROID, LEVOTHROID) 50 MCG tablet Take 1 tablet (50 mcg total) by mouth daily. 09/10/17  Yes Tower, Audrie Gallus, MD  losartan-hydrochlorothiazide (HYZAAR) 100-12.5 MG tablet Take 1 tablet by mouth daily. 09/10/17  Yes Tower, Audrie Gallus, MD  traMADol (ULTRAM) 50 MG tablet TAKE 1 TO 2 TABLETS BY MOUTH EVERY 8 HOURS AS NEEDED FOR PAIN Patient not taking: Reported on 02/20/2018 08/13/17   Judy Pimple, MD   Allergies  Allergen Reactions  . Amlodipine Besylate     REACTION: swelling  . Aricept [Donepezil Hcl] Diarrhea  . Atorvastatin     REACTION: unspecified  . Azithromycin     REACTION: nausea and vomiting  . Clarithromycin     REACTION: severe diarrhea  . Ibuprofen     REACTION: unspecified  . Levofloxacin     REACTION: diarrhea  . Piroxicam     REACTION: unspecified  . Simvastatin     Cramps in her feet  . Metoprolol     Asymptomatic but severe bradycardia    FAMILY HISTORY:  family history includes Cancer in her mother and sister; Heart disease in her father and sister. SOCIAL HISTORY:  reports that she has quit smoking. She has never used smokeless tobacco. She reports that she does not drink alcohol or use drugs.  REVIEW OF SYSTEMS:   Unable to obtain due  to critical illness   VITAL SIGNS: Temp:  [96.5 F (35.8 C)-97.6 F (36.4 C)] 96.5 F (35.8 C) (07/09 1030) Pulse Rate:  [101-132] 101 (07/09 0932) Resp:  [9-23] 12 (07/09 1030) BP: (116-148)/(60-70) 141/65 (07/09 1030) SpO2:  [93 %-97 %] 96 % (07/09 0932) FiO2 (%):  [4 %] 4 % (07/09 1030) Weight:  [52.8  kg (116 lb 8 oz)] 52.8 kg (116 lb 8 oz) (07/09 0541)  Physical Examination:  GENERAL:critically ill appearing, -resp distress HEAD: Normocephalic, atraumatic.  EYES: Pupils equal, round, reactive to light.  No scleral icterus.  MOUTH: Moist mucosal membrane. NECK: Supple. No JVD.  PULMONARY:- rhonchi, -wheezing, CTAB CARDIOVASCULAR: S1 and S2. Irregular rate and rhythm, tachycardic. No murmurs, rubs, or gallops. No radial or dorsalis pedis pulse appreciated. Capillary refill >3 seconds GASTROINTESTINAL: Mild diffuse tenderness with palpation. +Guarding.No masses. Diminished bowel sounds. No hepatosplenomegaly.  MUSCULOSKELETAL: No swelling, clubbing, or edema.  NEUROLOGIC: Alert and oriented to person and place. Able to follow basic commands SKIN: pale and clammy  I personally reviewed lab work that was obtained in last 24 hrs. CT abdomen with contrast independently reviewed and shows diffuse colonic wall thickening consistent with enterocolitis and likely ischemic etiology secondary to atherosclerotic changes.   ASSESSMENT / PLAN:  84 year old female with new onset atrial fibrillation, mesenteric ischemia, colitis, hypertension, and hyperlipidemia who presented to the ED this morning with acute onset of abdominal pain, nausea and vomiting. Intial lactate elevated at 4.3 with troponin of 0.29. CT abdomen shows diffuse colonic wall thickening consistent with ischemic enterocolitis. Patient hemeoccult positive, with frank blood in BM while in ED. Dr. Excell Seltzerooper of general surgery was consulted and saw the patient in the emergency department. Dr. Excell Seltzerooper stated the patient is a high risk candidate for surgery and would likely not survive surgery for her condition at her current state. Family additionally stated she would not want surgery for this condition. Dr. Excell Seltzerooper did recommend a heparin drip and consultation with vascular surgery. Heparin drip was started in the emergency department and will be  continued upon her admission to the ICU.   On examination, the patient is orientated to person and place. She becomes agitated when I attempt to place her nasal cannula on her nose. She refuses to wear the nasal cannula. o2 sat ranging between 93-95 during examination.  Patient's daughter is POA and states the patient would NOT want to be intubated. Patient is status DO NOT RESUSCITATE. Dr. Belia HemanKasa and myself discussed with the daughter that we will await vascular surgery consult to see if they recommend any procedure. If they do not recommend procedure then we will proceed with comfort care. The family is in agreeance with this plan.   1. Heparin drip for mesenteric ischemia and new unknown onset a fib: Heart rate ranging in the 110's, pressure normal 2. Vascular Surgery Consult 3. Palliative Care Consult 4. Zosyn for abx coverage for sepsis 5. LR and magnesium for hypokalemia and hypomagensemia 6. Morphine 2mg  q30 min PRN for pain  Critical Care Time devoted to patient care services described in this note is 45 minutes.   Overall, patient is critically ill, prognosis is guarded.  Patient with Multiorgan failure and at high risk for cardiac arrest and death.

## 2018-01-29 NOTE — ED Notes (Signed)
Pt changed into clean brief by this RN and Rosey Batheresa, Charity fundraiserN. With noted with positive hemoccult and frank bloody stool. MD made aware by this RN.

## 2018-01-29 NOTE — Anesthesia Postprocedure Evaluation (Signed)
Anesthesia Post Note  Patient: Rachel Knox  Procedure(s) Performed: VISCERAL ARTERY INTERVENTION (N/A )  Patient location during evaluation: Cath Lab Anesthesia Type: General Level of consciousness: awake and alert Pain management: pain level controlled Vital Signs Assessment: post-procedure vital signs reviewed and stable Respiratory status: spontaneous breathing, nonlabored ventilation, respiratory function stable and patient connected to nasal cannula oxygen Cardiovascular status: blood pressure returned to baseline and stable Postop Assessment: no apparent nausea or vomiting Anesthetic complications: no     Last Vitals:  Vitals:   03/18/2018 1830 03/18/2018 1840  BP: (!) 116/93 (!) 110/53  Pulse: (!) 107 (!) 109  Resp: (!) 21 19  Temp:  36.7 C  SpO2: 93% 93%    Last Pain:  Vitals:   03/18/2018 1812  TempSrc:   PainSc: Asleep                 Rosalina Dingwall S

## 2018-01-29 NOTE — Progress Notes (Signed)
Per Dr Belia HemanKasa [facility] will come to evaluate patient and then family will make a decision regarding plan of care.

## 2018-01-29 NOTE — Progress Notes (Signed)
Per Dr Belia HemanKasa stop heparin drip and place palliative care consult. MD aware of patients heart rate being in the 120's. Intermit oxygen readings, unable to get radial or pedal pulses. Patient is pale and cyanotic upper and lower extremities. At this time no additional orders given.

## 2018-01-29 NOTE — Op Note (Signed)
[facility] Percutaneous Study/Intervention Procedural Note   Date of Surgery: 02/11/2018  Surgeon(s): Hortencia Pilar   Assistants:None  Pre-operative Diagnosis: Ischemic colitis; sepsis; atrial fibrillation  Post-operative diagnosis: Ischemic colitis; sepsis; atrial fibrillation with embolization   Procedure(s) Performed: 1. Ultrasound guidance for vascular access right common femoral artery 2. Catheter placement into SMA from right femoral approach third order 3. Aortogram and selective SMA angiogram 4. Mechanical thrombectomy with the penumbra cat 6 device  5. Percutaneous transluminal angioplasty SMA with a 2 mm x 150 mm Ultraverse balloon             6.  StarClose closure device right femoral artery  Anesthesia: General by endotracheal intubation  Fluoro Time: 11.5 minutes  Contrast: 55 cc  Indications: Patient is a 84 year old woman who experienced the abrupt onset of abdominal pain earlier this a.m.  CT of the abdomen with contrast was consistent with embolization and she was noted to be in new onset atrial fibrillation in the emergency room.  Angiogram is performed to evaluate the lesion more thoroughly and potentially allow treatment. Risks and benefits were discussed and informed consent was obtained  Procedure: The patient was identified and appropriate procedural time out was performed. The patient was then placed supine on the table and prepped and draped in the usual sterile fashion.Moderate conscious sedation was administered during a face to face encounter with the patient with the RN monitoring their vital signs, mental status, telemetry and pulse oximetry throughout the procedure.  Ultrasound was used to evaluate the right common femoral artery. It was patent . A digital ultrasound image was acquired. A micropuncture needle was used to access the right common  femoral artery under direct ultrasound guidance and a microwire followed by micro-sheath was then inserted. A 0.035 J wire was advanced without resistance and a 5Fr sheath was placed. Pigtail catheter was then placed into the aorta and initially an AP aortogram was performed which showed the approximate location of the SMA which even in the AP projection demonstrated an abrupt cut off approximately halfway downstream from the origin. Lateral projection view was performed which showed typical orientations of the celiac and SMA.  SMA was noted to be widely patent in the area of the origin eliminating the possibility of a stricture or stenosis as suggested by the CT scan.  1000 units of heparin was then administered and allowed to circulate for several minutes  The SMA was then selected with a V S1 catheter. Selective imaging of the superior mesenteric artery demonstrated an abrupt cut off at its midportion as well as diffuse atherosclerotic disease in the periphery of all the remaining branches.  A 6 French Ansel Hydroflex sheath was then inserted and positioned with its tip several centimeters into the SMA in order to perform the intervention.  Subsequently in the case this was switched to a destination sheath.  The lesion was crossed without difficulty with a advantage 0.014 wire and then exchanged for a 035 Magic torque wire using a Kumpe catheter.  The penumbra cat 6 catheter was then prepped on the field and advanced through the sheath mechanical thrombectomy was then performed across the occlusion in the SMA. Multiple passes were made. Follow-up angiogram demonstrated modest improvement.  250 mcg of nitro was then infused intra-arterially.   I then selected a 2 mm diameter by 150 mm length Ultraverse balloon. The balloon was inflated to 7 Atm.  Patency of the SMA well beyond the original cutoff to the quaternary  branches was now seen on completion angiogram.   I elected to terminate the procedure.  The guide catheter was removed and oblique arteriogram was performed of the right femoral artery. StarClose closure device was deployed in the usual fashion with excellent hemostatic result.  Findings:  Aortogram:The aorta is diffusely diseased but there are no hemodynamically significant lesions. SMA:The SMA is patent at its origin and throughout the proximal half.  A large middle colic is noted but diffusely diseased.  Of the remaining patent branches the there is diffuse pruning distally.  An abrupt cut off is noted at the midportion as described above.  Following mechanical thrombectomy followed by a 2 mm balloon dilatation there is now patency of the SMA all the way down to its quaternary branches but with a marked improvement in the distal appearance of all of the SMA branches.     Disposition: Patient was taken to the recovery room in stable condition having tolerated the procedure well.   Hortencia Pilar 02/08/2018 6:03 PM

## 2018-01-29 NOTE — ED Notes (Signed)
Pt desat to 89% on 2L while admitting MD at bedside, pt turned up to 4L via [place], Dr. Luberta MutterKonidena remains at bedside and was notified.

## 2018-01-29 NOTE — Anesthesia Post-op Follow-up Note (Signed)
Anesthesia QCDR form completed.        

## 2018-01-29 NOTE — Anesthesia Procedure Notes (Signed)
Procedure Name: Intubation Date/Time: 02/01/2018 4:40 PM Performed by: Allean Found, CRNA Pre-anesthesia Checklist: Patient identified, Emergency Drugs available, Suction available, Patient being monitored and Timeout performed Patient Re-evaluated:Patient Re-evaluated prior to induction Oxygen Delivery Method: Circle system utilized Preoxygenation: Pre-oxygenation with 100% oxygen Induction Type: IV induction Ventilation: Mask ventilation without difficulty Laryngoscope Size: Mac and 3 Grade View: Grade I Tube type: Oral Tube size: 7.0 mm Number of attempts: 1 Airway Equipment and Method: Stylet Placement Confirmation: ETT inserted through vocal cords under direct vision,  positive ETCO2 and breath sounds checked- equal and bilateral Secured at: 21 cm Tube secured with: Tape Dental Injury: Teeth and Oropharynx as per pre-operative assessment

## 2018-01-30 ENCOUNTER — Encounter: Payer: Self-pay | Admitting: Vascular Surgery

## 2018-01-30 DIAGNOSIS — Z515 Encounter for palliative care: Secondary | ICD-10-CM

## 2018-01-30 DIAGNOSIS — R1084 Generalized abdominal pain: Secondary | ICD-10-CM

## 2018-01-30 DIAGNOSIS — Z66 Do not resuscitate: Secondary | ICD-10-CM

## 2018-01-30 LAB — BASIC METABOLIC PANEL
ANION GAP: 13 (ref 5–15)
BUN: 27 mg/dL — ABNORMAL HIGH (ref 8–23)
CALCIUM: 7.7 mg/dL — AB (ref 8.9–10.3)
CO2: 24 mmol/L (ref 22–32)
Chloride: 104 mmol/L (ref 98–111)
Creatinine, Ser: 1.43 mg/dL — ABNORMAL HIGH (ref 0.44–1.00)
GFR, EST AFRICAN AMERICAN: 37 mL/min — AB (ref >60)
GFR, EST NON AFRICAN AMERICAN: 32 mL/min — AB (ref >60)
GLUCOSE: 133 mg/dL — AB (ref 70–99)
Potassium: 3.9 mmol/L (ref 3.5–5.1)
Sodium: 141 mmol/L (ref 135–145)

## 2018-01-30 LAB — MAGNESIUM: MAGNESIUM: 1.9 mg/dL (ref 1.7–2.4)

## 2018-01-30 LAB — HEPARIN LEVEL (UNFRACTIONATED): HEPARIN UNFRACTIONATED: 1.12 [IU]/mL — AB (ref 0.30–0.70)

## 2018-01-30 LAB — CBC
HEMATOCRIT: 37.7 % (ref 35.0–47.0)
HEMOGLOBIN: 12.3 g/dL (ref 12.0–16.0)
MCH: 31.6 pg (ref 26.0–34.0)
MCHC: 32.8 g/dL (ref 32.0–36.0)
MCV: 96.4 fL (ref 80.0–100.0)
Platelets: 176 10*3/uL (ref 150–440)
RBC: 3.91 MIL/uL (ref 3.80–5.20)
RDW: 15.7 % — ABNORMAL HIGH (ref 11.5–14.5)
WBC: 17 10*3/uL — ABNORMAL HIGH (ref 3.6–11.0)

## 2018-01-30 LAB — TROPONIN I
TROPONIN I: 0.48 ng/mL — AB (ref <0.03)
Troponin I: 0.42 ng/mL (ref <0.03)

## 2018-01-30 MED ORDER — MORPHINE SULFATE (PF) 2 MG/ML IV SOLN
1.0000 mg | INTRAVENOUS | Status: DC | PRN
  Administered 2018-01-30: 1 mg via INTRAVENOUS

## 2018-01-30 MED ORDER — LORAZEPAM 2 MG/ML IJ SOLN: 1.0000 mg | INTRAMUSCULAR | Status: DC | PRN

## 2018-01-30 MED ORDER — MORPHINE SULFATE (PF) 2 MG/ML IV SOLN: 1.0000 mg | INTRAVENOUS | Status: DC | PRN

## 2018-01-30 MED ORDER — MORPHINE SULFATE (PF) 2 MG/ML IV SOLN
2.0000 mg | INTRAVENOUS | Status: AC
  Administered 2018-01-30: 2 mg via INTRAVENOUS

## 2018-02-03 LAB — CULTURE, BLOOD (ROUTINE X 2)
CULTURE: NO GROWTH
CULTURE: NO GROWTH
Special Requests: ADEQUATE
Special Requests: ADEQUATE

## 2018-02-21 NOTE — Consult Note (Signed)
[facility]  CARDIOLOGY CONSULT NOTE  Patient ID: Rachel Knox MRN: [id number] DOB/AGE: 82-Nov-1929 82 y.o.  Admit date: 02-22-18 Referring Physician Sonda Rumble, NP Primary Physician Dr. Roxy Manns Primary Cardiologist N/A Reason for Consultation New onset atrial fibrillation with RVR   HPI: Rachel Knox is an 84 year old female with a history of hypertension, hyperlipidemia, and dementia who was admitted to [facility] on 01/29/09 for severe abdominal pain, nausea, and vomiting which began in the morning. Patient's daughter states that for 4 weeks the patient had been acting unusual and becoming increasingly confused. Rachel Knox underwent abdominal surgery with Dr. Gilda Crease for ischemic colitis yesterday 22-Feb-2018. Percutaneous transluminal angioplasty of the SMA was performed. Post operatively, patient was noted to be in new onset atrial fibrillation with RVR, which warranted the cardiology consult.   Patient has no significant cardiac history and is not followed by a cardiologist.   Due to the patient's state of agitation, all information was provided by the patient's daughter.   Review of systems complete and found to be negative unless listed above     Past Medical History:  Diagnosis Date  . Diverticulosis of colon   . Hyperlipidemia   . Hypertension   . IBS (irritable bowel syndrome)   . Osteoarthritis   . Urinary incontinence     Past Surgical History:  Procedure Laterality Date  . ABDOMINAL HYSTERECTOMY    . APPENDECTOMY     with infection (surgery x2)  . BLADDER SURGERY     bladder tack  . BREAST CYST EXCISION  08/2002   breast discharge, cyst removed  . BREAST SURGERY     breast cyst needle biopsy  . FOOT NEUROMA SURGERY    . SHOULDER SURGERY     spur  . TONSILLECTOMY    . VISCERAL ARTERY INTERVENTION N/A 2018-02-22   Procedure: VISCERAL ARTERY INTERVENTION;  Surgeon: Renford Dills, MD;  Location: [facility] INVASIVE CV LAB;  Service: Cardiovascular;  Laterality: N/A;     Medications Prior to Admission  Medication Sig Dispense Refill Last Dose  . levothyroxine (SYNTHROID, LEVOTHROID) 50 MCG tablet Take 1 tablet (50 mcg total) by mouth daily. 30 tablet 3   . losartan-hydrochlorothiazide (HYZAAR) 100-12.5 MG tablet Take 1 tablet by mouth daily. 90 tablet 3   . traMADol (ULTRAM) 50 MG tablet TAKE 1 TO 2 TABLETS BY MOUTH EVERY 8 HOURS AS NEEDED FOR PAIN (Patient not taking: Reported on 2018/02/22) 60 tablet 0 Not Taking at Unknown time   Social History   Socioeconomic History  . Marital status: Divorced    Spouse name: Not on file  . Number of children: Not on file  . Years of education: Not on file  . Highest education level: Not on file  Occupational History  . Not on file  Social Needs  . Financial resource strain: Not on file  . Food insecurity:    Worry: Not on file    Inability: Not on file  . Transportation needs:    Medical: Not on file    Non-medical: Not on file  Tobacco Use  . Smoking status: Former Games developer  . Smokeless tobacco: Never Used  Substance and Sexual Activity  . Alcohol use: No    Alcohol/week: 0.0 oz  . Drug use: No  . Sexual activity: Not on file  Lifestyle  . Physical activity:    Days per week: Not on file    Minutes per session: Not on file  . Stress: Not on file  Relationships  . Social connections:    Talks on phone: Not on file    Gets together: Not on file    Attends religious service: Not on file    Active member of club or organization: Not on file    Attends meetings of clubs or organizations: Not on file    Relationship status: Not on file  . Intimate partner violence:    Fear of current or ex partner: Not on file    Emotionally abused: Not on file    Physically abused: Not on file    Forced sexual activity: Not on file  Other Topics Concern  . Not on file  Social History Narrative  . Not on file    Family History  Problem Relation Age of Onset  . Cancer Mother        kidney cancer  . Heart  disease Father        MI  . Cancer Sister        lung  . Heart disease Sister        MI      Review of systems complete and found to be negative unless listed above      PHYSICAL EXAM  General: Very thin, frail. Patient is severely agitated and fidgeting in the bed.  HEENT:  Normocephalic and atramatic Neck:  No JVD.  Lungs: Clear bilaterally to auscultation and percussion. Heart: Irregular rate, irregular rhythm. No murmurs or gallops.  Abdomen: Bowel sounds are normoactive, abdomen very tender Msk:  Back normal. Normal strength and tone for age. Extremities: Severe cyanosis in bilateral feet, worse on the right foot. No edema noted.  Neuro: Very agitated, confused  Psych:  Very agitated, confused    Labs:   Lab Results  Component Value Date   WBC 17.0 (H) 02/11/2018   HGB 12.3 02/06/2018   HCT 37.7 02/04/2018   MCV 96.4 02/01/2018   PLT 176 02/01/2018    Recent Labs  Lab 02/23/2018 0537 01/25/2018 0155  NA 139 141  K 3.1* 3.9  CL 97* 104  CO2 26 24  BUN 24* 27*  CREATININE 1.30* 1.43*  CALCIUM 8.6* 7.7*  PROT 5.3*  --   BILITOT 0.8  --   ALKPHOS 87  --   ALT 44  --   AST 73*  --   GLUCOSE 127* 133*   Lab Results  Component Value Date   TROPONINI 0.48 (HH) 01/28/2018    Lab Results  Component Value Date   CHOL 240 (H) 09/10/2017   CHOL 291 (H) 05/16/2016   CHOL 285 (H) 03/20/2016   Lab Results  Component Value Date   HDL 37.60 (L) 09/10/2017   HDL 41.30 05/16/2016   HDL 43.70 03/20/2016   Lab Results  Component Value Date   LDLCALC 167 (H) 09/10/2017   LDLCALC 212 (H) 05/16/2016   LDLCALC 114 (H) 12/25/2012   Lab Results  Component Value Date   TRIG 176.0 (H) 09/10/2017   TRIG 191.0 (H) 05/16/2016   TRIG 213.0 (H) 03/20/2016   Lab Results  Component Value Date   CHOLHDL 6 09/10/2017   CHOLHDL 7 05/16/2016   CHOLHDL 7 03/20/2016   Lab Results  Component Value Date   LDLDIRECT 192.0 03/20/2016   LDLDIRECT 120.0 01/29/2015       Radiology: Ct Head Wo Contrast  Result Date: 23-Feb-2018 CLINICAL DATA:  84 year old female with generalized weakness. Initial encounter. EXAM: CT HEAD WITHOUT CONTRAST TECHNIQUE: Contiguous axial images were obtained  from the base of the skull through the vertex without intravenous contrast. COMPARISON:  None. FINDINGS: Brain: No intracranial hemorrhage. 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 Prominent chronic microvascular changes. Moderate global atrophy. Ventricular prominence probably related to atrophy rather than hydrocephalus. Patent aqueduct. No intracranial mass lesion noted on this unenhanced exam. Vascular: Vascular calcifications. Skull: Negative Sinuses/Orbits: No acute orbital abnormality. Visualized paranasal sinuses clear. Other: Minimal partial opacification mastoid air cells. Middle ear cavities clear. 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 Prominent chronic microvascular changes. No intracranial hemorrhage. Global atrophy. Electronically Signed   By: Lacy DuverneySteven  Olson M.D.   On: 02/12/2018 07:26   Ct Abdomen Pelvis W Contrast  Result Date: 01/26/2018 CLINICAL DATA:  84 year old female with generalized weakness and abdominal pain. Initial encounter. EXAM: CT ABDOMEN AND PELVIS WITH CONTRAST TECHNIQUE: Multidetector CT imaging of the abdomen and pelvis was performed using the standard protocol following bolus administration of intravenous contrast. CONTRAST:  75mL OMNIPAQUE IOHEXOL 300 MG/ML  SOLN COMPARISON:  10/21/2008 CT abdomen and pelvis. FINDINGS: Lower chest: Small left-sided pleural effusion. Mild basilar atelectasis. Mild cardiomegaly. Possible increased right heart pressure with prominent hepatic veins. Hepatobiliary: 6 mm low-density structure abdominal liver possibly a cyst. Streak artifact without worrisome hepatic lesion noted. Dilated gallbladder. No calcified stones or obvious inflammation. Prominent common bile duct probably related to patient's age. No  calcified common bile duct stone or obstructing lesion. Pancreas: No worrisome pancreatic mass or primary pancreatic inflammatory process. Spleen: No splenic mass or enlargement. Adrenals/Urinary Tract: No obstructing stone or hydronephrosis. Delayed images without significant excretion raising possibility poor renal function. Right lower pole 1.2 cm cyst. Streak artifact without worrisome renal lesion noted. No adrenal mass. Noncontrast filled urinary bladder without worrisome abnormality. Stomach/Bowel: Diffuse colonic wall thickening. Less notable small bowel wall thickening. Findings consistent with enterocolitis (more notable involving the colon). Etiology indeterminate possibly ischemic in origin given atherosclerotic changes. Infectious process secondary consideration. Scattered diverticula most notable sigmoid colon. Circumferential thickening distal esophagus may be related to under distension. Fluid-filled stomach. Streak artifact duodenal sweep without primary inflammatory process identified. Duodenal diverticula incidentally noted. No pneumatosis identified. Vascular/Lymphatic: Atherosclerotic changes aorta and aortic branch vessels. Mild ectasia abdominal aorta without aortic aneurysm. Narrowing proximal celiac artery. Narrowing proximal superior mesenteric artery with distal branch occlusion. Narrowing origin inferior mesenteric artery. Scattered normal size lymph nodes without adenopathy. Reproductive: Post hysterectomy.  No worrisome adnexal mass. Other: Mild third spacing of fluid. No free intraperitoneal air or portal gas. Small fat containing periumbilical hernia. Musculoskeletal: Scoliosis lumbar spine with superimposed degenerative changes. Bilateral hip joint and sacroiliac joint degenerative changes. IMPRESSION: Diffuse colonic wall thickening. Less notable small bowel wall thickening. Findings consistent with enterocolitis (more notable involving the colon). Etiology indeterminate possibly  ischemic in origin given atherosclerotic changes. Infectious process secondary consideration. Narrowing proximal superior mesenteric artery with distal branch occlusion. Narrowing origin inferior mesenteric artery. Narrowing proximal celiac artery. Aortic Atherosclerosis (ICD10-I70.0). Small left-sided pleural effusion. Possible increased right heart pressures indicated by prominent hepatic veins. Delayed excretion of contrast into kidneys may indicate poor renal function. Scoliosis lumbar spine with superimposed degenerative changes. These results were called by telephone at the time of interpretation on 02/16/2018 at 7:13 am to Dr. Merrily BrittleNEIL RIFENBARK , who verbally acknowledged these results. Electronically Signed   By: Lacy DuverneySteven  Olson M.D.   On: 02/06/2018 07:22   Dg Chest Port 1 View  Result Date: 02/02/2018 CLINICAL DATA:  Weakness and shortness of breath. EXAM: PORTABLE CHEST 1 VIEW COMPARISON:  Chest  radiograph 07/01/2013. Included portion from abdominal CT obtained same day. FINDINGS: Heart is enlarged. Atherosclerosis of the aortic arch. Left pleural effusion basilar atelectasis. No focal airspace disease in the right lung. No pulmonary edema. No pneumothorax. No acute osseous abnormalities. IMPRESSION: Cardiomegaly. Small left pleural effusion with adjacent atelectasis. Aortic Atherosclerosis (ICD10-I70.0). Electronically Signed   By: Rubye Oaks M.D.   On: 02/15/18 06:58    EKG: Atrial fibrillation with RVR. Rate 100-125  ASSESSMENT AND PLAN:   1. New onset atrial fibrillation with RVR  - Likely due to post-operative state   - Rate between 100-125. Patient given Morphine for agitation - will see if that slows rate down   - Consider Digoxin or Amiodarone (due to low blood pressure) if rate doesn't come down with pain medication  2. Elevated troponin (.48)   - Likely due to demand ischemia from ischemic colitis   - No ischemic changes noted on ECG  3. Pain/agitation   - Morphine 2mg  q 30  minute PRN for pain   The history, physical exam findings, and plan of care were discussed with Dr. Harold Hedge and all decision making was made in collaboration.    Signed: Andi Hence  PA-C 02/03/2018, 10:09 AM

## 2018-02-21 NOTE — Progress Notes (Signed)
   01/25/2018 1340  Clinical Encounter Type  Visited With Patient and family together  Visit Type Initial;Spiritual support  Referral From Nurse;Palliative care team  Consult/Referral To Chaplain  Spiritual Encounters  Spiritual Needs Prayer;Emotional   CH visited with PT and her daughter. PT was asleep throughout the visit due to surgery recovery. PT has become palliative care. Daughter seems to be a peace with her mother's health but still wishes it could be different. CH encouraged her to continue with the good thoughts and remember the funny things that has happened with her mom over the years. [facility].

## 2018-02-21 NOTE — Progress Notes (Signed)
CRITICAL CARE NOTE  CC  Follow up acute abdominal pain  SUBJECTIVE Patient remains critically ill Prognosis is guarded S/p stent to midportion of SMA 02/06/2018 secondary to mesenteric ischemia. Her daughter is at bedside this morning.. Her daughter tells me that she thought her mother would have a turning point by now, but that she has been retching this morning with no episodes of vomiting.  SIGNIFICANT EVENTS  After admission to the ICU yesterday morning, patient underwent stent placement to the midportion of the SMA by vascular surgery. Patient  transferred back to the ICU yesterday evening and extubated.  BP (!) 91/46   Pulse (!) 103   Temp (!) 97.5 F (36.4 C) (Rectal)   Resp 17   Ht 5\' 5"  (1.651 m)   Wt 52.8 kg (116 lb 8 oz)   LMP 07/24/1980   SpO2 97%   BMI 19.39 kg/m    REVIEW OF SYSTEMS  PATIENT IS UNABLE TO PROVIDE COMPLETE REVIEW OF SYSTEM S DUE TO SEVERE CRITICAL ILLNESS AND ENCEPHALOPATHY   PHYSICAL EXAMINATION:  GENERAL:critically ill appearing, -resp distress HEAD: Normocephalic, atraumatic.  EYES: Pupils equal, round, reactive to light.  No scleral icterus.  MOUTH: Moist mucosal membrane, dry lips. NECK: Supple. No thyromegaly. No nodules. No JVD.  PULMONARY: CTAB CARDIOVASCULAR: S1 and S2. Irregular rate and rhythm. NO dorsalis pedis, popliteal, or radial pulses felt.  GASTROINTESTINAL: Soft, nontender, -distended. No masses. Diminished bowel sounds. No hepatosplenomegaly.  MUSCULOSKELETAL: No swelling, clubbing, or edema.  NEUROLOGIC: Alert and oriented x 2, patient able to follow basic commands (open eyes, open mouth, squeeze my hands) SKIN:intact, cool, clammy. Patient very pale appearing.   ASSESSMENT AND PLAN SYNOPSIS  84 year old female s/p SMA stent secondary to mesenteric ischemia. Patient also with GI bleed and new onset atrial fibrillation. Patient states she is in no pain this morning, but that she is nauseous. Patient appears uncomfortable  and daughter states she has been retching this morning with no vomit. PRN zofran given.  Patient's daughter is POA and states the patient would NOT want to be intubated. Patient is status DO NOT RESUSCITATE.  Acute Mesenteric Ischemia -s/p SMA stent on 7/9 by vascular surgery -continue heparin drip -follow heparin levels for appropriate drip rate  New Onset Atrial Fibrillation -continue heparin drip -rate trending down from yesterday, ranging 104-107bpm -troponin continue to trend upward: 0.48  -hypokalemia and hypomagnesemia have normalized   GI bleed: -continue to monitor with heparin drip  CARDIAC ICU monitoring  ID -continue IV abx as prescibed: zosyn -follow up cultures   DVT/GI PRX ordered TRANSFUSIONS AS NEEDED MONITOR FSBS ASSESS the need for LABS as needed   Critical Care Time devoted to patient care services described in this note is 30 minutes.   Overall, patient is critically ill, prognosis is guarded.  Patient with Multiorgan failure and at high risk for cardiac arrest and death.

## 2018-02-21 NOTE — Progress Notes (Signed)
[facility] [place] [facility]   PATIENT NAME: Rachel Knox    MR#:  [id number]  DATE OF BIRTH:  02-09-1928  SUBJECTIVE: admitted for mesenteric ischemia, ARDS, initially admitted to intensive care unit, patient had MS assistant by vascular yesterday, continues to have hypertension, for circulation in legs. Patient is made comfort care after discussing with healthcare power of attorney patients daughter.   CHIEF COMPLAINT:   Chief Complaint  Patient presents with  . Abdominal Pain  abdominal pain is better than yesterday.  REVIEW OF SYSTEMS:  unable to obtain review of systems secondary to severe critical illness, encephalopathy. DRUG ALLERGIES:   Allergies  Allergen Reactions  . Amlodipine Besylate     REACTION: swelling  . Aricept [Donepezil Hcl] Diarrhea  . Atorvastatin     REACTION: unspecified  . Azithromycin     REACTION: nausea and vomiting  . Clarithromycin     REACTION: severe diarrhea  . Ibuprofen     REACTION: unspecified  . Levofloxacin     REACTION: diarrhea  . Piroxicam     REACTION: unspecified  . Simvastatin     Cramps in her feet  . Metoprolol     Asymptomatic but severe bradycardia    VITALS:  Blood pressure (!) 91/46, pulse (!) 103, temperature (!) 97.5 F (36.4 C), temperature source Rectal, resp. rate 17, height 5\' 5"  (1.651 m), weight 52.8 kg (116 lb 8 oz), last menstrual period 07/24/1980, SpO2 97 %.  PHYSICAL EXAMINATION:  GENERAL:  84 y.o.-year-old patient lying in the bed with no acute distress. Very fragile 84 year old female EYES: Pupils equal, round, reactive to light . No scleral icterus. Extraocular muscles intact.  HEENT: Head atraumatic, normocephalic. Oropharynx and nasopharynx clear.  NECK:  Supple, no jugular venous distention. No thyroid enlargement, no tenderness.  LUNGS: Normal breath sounds bilaterally, no wheezing, rales,rhonchi or crepitation. No use of accessory muscles of respiration.   CARDIOVASCULAR: S1, S2irregularl. No murmurs, rubs, or gallops.  ABDOMEN: Soft, nontender, nondistended. Bowel sounds present. No organomegaly or mass.  EXTREMITIES: No pedal edema, cyanosis, or clubbing.  NEUROLOGIC: full neuro exam could  not be done secondary to encephalopathy  PSYCHIATRIC:  Encephalopathic. SKIN: No obvious rash, lesion, or ulcer.    LABORATORY PANEL:   CBC Recent Labs  Lab February 11, 2018 0155  WBC 17.0*  HGB 12.3  HCT 37.7  PLT 176   ------------------------------------------------------------------------------------------------------------------  Chemistries  Recent Labs  Lab 02/06/2018 0537 2018/02/11 0155  NA 139 141  K 3.1* 3.9  CL 97* 104  CO2 26 24  GLUCOSE 127* 133*  BUN 24* 27*  CREATININE 1.30* 1.43*  CALCIUM 8.6* 7.7*  MG 1.4* 1.9  AST 73*  --   ALT 44  --   ALKPHOS 87  --   BILITOT 0.8  --    ------------------------------------------------------------------------------------------------------------------  Cardiac Enzymes Recent Labs  Lab February 11, 2018 0650  TROPONINI 0.48*   ------------------------------------------------------------------------------------------------------------------  RADIOLOGY:  Ct Head Wo Contrast  Result Date: 01/31/2018 CLINICAL DATA:  84 year old female with generalized weakness. Initial encounter. EXAM: CT HEAD WITHOUT CONTRAST TECHNIQUE: Contiguous axial images were obtained from the base of the skull through the vertex without intravenous contrast. COMPARISON:  None. FINDINGS: Brain: No intracranial hemorrhage. 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 Prominent chronic microvascular changes. Moderate global atrophy. Ventricular prominence probably related to atrophy rather than hydrocephalus. Patent aqueduct. No intracranial mass lesion noted on this unenhanced exam. Vascular: Vascular calcifications. Skull: Negative Sinuses/Orbits: No acute orbital abnormality. Visualized paranasal sinuses  clear. Other: Minimal  partial opacification mastoid air cells. Middle ear cavities clear. 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 Prominent chronic microvascular changes. No intracranial hemorrhage. Global atrophy. Electronically Signed   By: Lacy DuverneySteven  Olson M.D.   On: 2017-09-19 07:26   Ct Abdomen Pelvis W Contrast  Result Date: 01/21/2018 CLINICAL DATA:  84 year old female with generalized weakness and abdominal pain. Initial encounter. EXAM: CT ABDOMEN AND PELVIS WITH CONTRAST TECHNIQUE: Multidetector CT imaging of the abdomen and pelvis was performed using the standard protocol following bolus administration of intravenous contrast. CONTRAST:  75mL OMNIPAQUE IOHEXOL 300 MG/ML  SOLN COMPARISON:  10/21/2008 CT abdomen and pelvis. FINDINGS: Lower chest: Small left-sided pleural effusion. Mild basilar atelectasis. Mild cardiomegaly. Possible increased right heart pressure with prominent hepatic veins. Hepatobiliary: 6 mm low-density structure abdominal liver possibly a cyst. Streak artifact without worrisome hepatic lesion noted. Dilated gallbladder. No calcified stones or obvious inflammation. Prominent common bile duct probably related to patient's age. No calcified common bile duct stone or obstructing lesion. Pancreas: No worrisome pancreatic mass or primary pancreatic inflammatory process. Spleen: No splenic mass or enlargement. Adrenals/Urinary Tract: No obstructing stone or hydronephrosis. Delayed images without significant excretion raising possibility poor renal function. Right lower pole 1.2 cm cyst. Streak artifact without worrisome renal lesion noted. No adrenal mass. Noncontrast filled urinary bladder without worrisome abnormality. Stomach/Bowel: Diffuse colonic wall thickening. Less notable small bowel wall thickening. Findings consistent with enterocolitis (more notable involving the colon). Etiology indeterminate possibly ischemic in origin given atherosclerotic changes. Infectious process secondary  consideration. Scattered diverticula most notable sigmoid colon. Circumferential thickening distal esophagus may be related to under distension. Fluid-filled stomach. Streak artifact duodenal sweep without primary inflammatory process identified. Duodenal diverticula incidentally noted. No pneumatosis identified. Vascular/Lymphatic: Atherosclerotic changes aorta and aortic branch vessels. Mild ectasia abdominal aorta without aortic aneurysm. Narrowing proximal celiac artery. Narrowing proximal superior mesenteric artery with distal branch occlusion. Narrowing origin inferior mesenteric artery. Scattered normal size lymph nodes without adenopathy. Reproductive: Post hysterectomy.  No worrisome adnexal mass. Other: Mild third spacing of fluid. No free intraperitoneal air or portal gas. Small fat containing periumbilical hernia. Musculoskeletal: Scoliosis lumbar spine with superimposed degenerative changes. Bilateral hip joint and sacroiliac joint degenerative changes. IMPRESSION: Diffuse colonic wall thickening. Less notable small bowel wall thickening. Findings consistent with enterocolitis (more notable involving the colon). Etiology indeterminate possibly ischemic in origin given atherosclerotic changes. Infectious process secondary consideration. Narrowing proximal superior mesenteric artery with distal branch occlusion. Narrowing origin inferior mesenteric artery. Narrowing proximal celiac artery. Aortic Atherosclerosis (ICD10-I70.0). Small left-sided pleural effusion. Possible increased right heart pressures indicated by prominent hepatic veins. Delayed excretion of contrast into kidneys may indicate poor renal function. Scoliosis lumbar spine with superimposed degenerative changes. These results were called by telephone at the time of interpretation on 02/11/2018 at 7:13 am to Dr. Merrily BrittleNEIL RIFENBARK , who verbally acknowledged these results. Electronically Signed   By: Lacy DuverneySteven  Olson M.D.   On: 2017-09-19 07:22   Dg  Chest Port 1 View  Result Date: 02/12/2018 CLINICAL DATA:  Weakness and shortness of breath. EXAM: PORTABLE CHEST 1 VIEW COMPARISON:  Chest radiograph 07/01/2013. Included portion from abdominal CT obtained same day. FINDINGS: Heart is enlarged. Atherosclerosis of the aortic arch. Left pleural effusion basilar atelectasis. No focal airspace disease in the right lung. No pulmonary edema. No pneumothorax. No acute osseous abnormalities. IMPRESSION: Cardiomegaly. Small left pleural effusion with adjacent atelectasis. Aortic Atherosclerosis (ICD10-I70.0). Electronically Signed   By: Rubye OaksMelanie  Ehinger M.D.   On:  02/01/2018 06:58    EKG:   Orders placed or performed during the hospital encounter of 02/15/2018  . ED EKG  . ED EKG  . EKG 12-Lead  . EKG 12-Lead    ASSESSMENT AND PLAN:   84 year old history of sudden onset of abdominal pain, and found have atrial fibrillation, mesenteric ischemia status SMA stent, and heparin drip because of advanced age., Encephalopathy, worsening kidney function family decided to pursue comfort measures. Patient started on full comfort measures. New morphine,, Ativan  D/w daughter,palliative care. All the records are reviewed and case discussed with Care Management/Social Workerr. Management plans discussed with the patient, family and they are in agreement.  CODE STATUS: DNR  TOTAL TIME TAKING CARE OF THIS PATIENT: 45 minutes.   POSSIBLE D/C IN 1-2DAYS, DEPENDING ON CLINICAL CONDITION.   Katha Hamming M.D on 2018/02/08 at 2:18 PM  Between 7am to 6pm - Pager - [phone]  After 6pm go to [email or web address] - password [email or web address] [facility]  [place] [facility]  Office  [phone]  CC: Primary care physician; Tower, Audrie Gallus, MD   Note: This dictation was prepared with Dragon dictation along with smaller phrase technology. Any transcriptional errors that result from this process are unintentional.

## 2018-02-21 NOTE — Progress Notes (Addendum)
CRITICAL CARE NOTE  CC  Acute abd pain  SUBJECTIVE S/p stent to SMA by Vascular surgery      SIGNIFICANT EVENTS    BP (!) 93/50   Pulse (!) 102   Temp (!) 97.5 F (36.4 C) (Rectal)   Resp 14   Ht 5\' 5"  (1.651 m)   Wt 116 lb 8 oz (52.8 kg)   LMP 07/24/1980   SpO2 98%   BMI 19.39 kg/m    REVIEW OF SYSTEMS  PATIENT IS UNABLE TO PROVIDE COMPLETE REVIEW OF SYSTEM S DUE TO SEVERE CRITICAL ILLNESS AND ENCEPHALOPATHY   PHYSICAL EXAMINATION:  GENERAL:critically ill appearing HEAD: Normocephalic, atraumatic.  EYES: Pupils equal, round, reactive to light.  No scleral icterus.  MOUTH: Moist mucosal membrane. NECK: Supple. No thyromegaly. No nodules. No JVD.  PULMONARY: +rhonchi CARDIOVASCULAR: S1 and S2. Regular rate and rhythm. No murmurs, rubs, or gallops.  GASTROINTESTINAL: Soft, nontender, MUSCULOSKELETAL: No swelling, clubbing, or edema.  NEUROLOGIC: alert and awake SKIN:intact,warm,dry  ASSESSMENT AND PLAN 84 year old female with new onset atrial fibrillation, mesenteric ischemia, colitis, hypertension, and hyperlipidemia who presented to the ED this morning with acute onset of abdominal pain, nausea and vomiting.  Patient's daughter is POA and states the patient would NOT want to be intubated. Patient is status DO NOT RESUSCITATE.    Appreciate vascular surgery consult and assessment  1. Heparin drip for mesenteric ischemia and new unknown onset a fib: Heart rate ranging in the 110's, pressure normal 2. Vascular Surgery Consult 3. Palliative Care Consult 4. Zosyn for abx coverage for sepsis 5. LR and magnesium for hypokalemia and hypomagensemia 6. Morphine 2mg  q30 min PRN for pain   Lucie LeatherKurian David Iyani Dresner, M.D.  [facility]  Medical Director [facility] Medical Director [facility]

## 2018-02-21 NOTE — Progress Notes (Signed)
Pt expired at 19:40 pm.  Family in room.Family refused a chaplin services. MD Ihor AustinPavan Pyreddy was notified and Nursing Supervisor, Larita FifeStephanie Brothers was also notified. [facility] was notified. Spoke with Sports administratorage Creator at the [employer].  Continue to monitor.

## 2018-02-21 NOTE — Progress Notes (Signed)
CC: Abdominal pain Subjective: This patient with bowel ischemia.  She had a stent placed in her SMA yesterday with some resolution of her pain.  This morning she had considerable pain again with some vomiting.  Is more comfortable now.  Her daughter is at bedside who I had met in the ER originally.  Objective: Vital signs in last 24 hours: Temp:  [97.5 F (36.4 C)-98 F (36.7 C)] 97.5 F (36.4 C) (07/10 0341) Pulse Rate:  [99-130] 103 (07/10 0700) Resp:  [11-31] 17 (07/10 0700) BP: (89-135)/(45-97) 91/46 (07/10 0700) SpO2:  [88 %-99 %] 97 % (07/10 0700) Last BM Date: 02/05/2018(per family report)  Intake/Output from previous day: 07/09 0701 - 07/10 0700 In: 2065.5 [I.V.:1540.7; IV Piggyback:524.8] Out: 375 [Urine:350; Blood:25] Intake/Output this shift: Total I/O In: 3 [I.V.:3] Out: -   Physical exam:  Vital signs reviewed  Patient preferring to lay on her left side.  She is somnolent.  Abdomen is slightly distended moderately tender no percussion tenderness. No icterus no jaundice Nontender calves.  Lab Results: CBC  Recent Labs    02/18/2018 0537 02/15/18 0155  WBC 14.9* 17.0*  HGB 13.9 12.3  HCT 42.1 37.7  PLT 227 176   BMET Recent Labs    01/27/2018 0537 February 15, 2018 0155  NA 139 141  K 3.1* 3.9  CL 97* 104  CO2 26 24  GLUCOSE 127* 133*  BUN 24* 27*  CREATININE 1.30* 1.43*  CALCIUM 8.6* 7.7*   PT/INR Recent Labs    02/01/2018 0749  LABPROT 16.6*  INR 1.35   ABG No results for input(s): PHART, HCO3 in the last 72 hours.  Invalid input(s): PCO2, PO2  Studies/Results: Ct Head Wo Contrast  Result Date: 01/22/2018 CLINICAL DATA:  84 year old female with generalized weakness. Initial encounter. EXAM: CT HEAD WITHOUT CONTRAST TECHNIQUE: Contiguous axial images were obtained from the base of the skull through the vertex without intravenous contrast. COMPARISON:  None. FINDINGS: Brain: No intracranial hemorrhage. 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  Prominent chronic microvascular changes. Moderate global atrophy. Ventricular prominence probably related to atrophy rather than hydrocephalus. Patent aqueduct. No intracranial mass lesion noted on this unenhanced exam. Vascular: Vascular calcifications. Skull: Negative Sinuses/Orbits: No acute orbital abnormality. Visualized paranasal sinuses clear. Other: Minimal partial opacification mastoid air cells. Middle ear cavities clear. 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 Prominent chronic microvascular changes. No intracranial hemorrhage. Global atrophy. Electronically Signed   By: Genia Del M.D.   On: 01/24/2018 07:26   Ct Abdomen Pelvis W Contrast  Result Date: 02/14/2018 CLINICAL DATA:  84 year old female with generalized weakness and abdominal pain. Initial encounter. EXAM: CT ABDOMEN AND PELVIS WITH CONTRAST TECHNIQUE: Multidetector CT imaging of the abdomen and pelvis was performed using the standard protocol following bolus administration of intravenous contrast. CONTRAST:  29m OMNIPAQUE IOHEXOL 300 MG/ML  SOLN COMPARISON:  10/21/2008 CT abdomen and pelvis. FINDINGS: Lower chest: Small left-sided pleural effusion. Mild basilar atelectasis. Mild cardiomegaly. Possible increased right heart pressure with prominent hepatic veins. Hepatobiliary: 6 mm low-density structure abdominal liver possibly a cyst. Streak artifact without worrisome hepatic lesion noted. Dilated gallbladder. No calcified stones or obvious inflammation. Prominent common bile duct probably related to patient's age. No calcified common bile duct stone or obstructing lesion. Pancreas: No worrisome pancreatic mass or primary pancreatic inflammatory process. Spleen: No splenic mass or enlargement. Adrenals/Urinary Tract: No obstructing stone or hydronephrosis. Delayed images without significant excretion raising possibility poor renal function. Right lower pole 1.2 cm cyst. Streak artifact without  worrisome renal lesion  noted. No adrenal mass. Noncontrast filled urinary bladder without worrisome abnormality. Stomach/Bowel: Diffuse colonic wall thickening. Less notable small bowel wall thickening. Findings consistent with enterocolitis (more notable involving the colon). Etiology indeterminate possibly ischemic in origin given atherosclerotic changes. Infectious process secondary consideration. Scattered diverticula most notable sigmoid colon. Circumferential thickening distal esophagus may be related to under distension. Fluid-filled stomach. Streak artifact duodenal sweep without primary inflammatory process identified. Duodenal diverticula incidentally noted. No pneumatosis identified. Vascular/Lymphatic: Atherosclerotic changes aorta and aortic branch vessels. Mild ectasia abdominal aorta without aortic aneurysm. Narrowing proximal celiac artery. Narrowing proximal superior mesenteric artery with distal branch occlusion. Narrowing origin inferior mesenteric artery. Scattered normal size lymph nodes without adenopathy. Reproductive: Post hysterectomy.  No worrisome adnexal mass. Other: Mild third spacing of fluid. No free intraperitoneal air or portal gas. Small fat containing periumbilical hernia. Musculoskeletal: Scoliosis lumbar spine with superimposed degenerative changes. Bilateral hip joint and sacroiliac joint degenerative changes. IMPRESSION: Diffuse colonic wall thickening. Less notable small bowel wall thickening. Findings consistent with enterocolitis (more notable involving the colon). Etiology indeterminate possibly ischemic in origin given atherosclerotic changes. Infectious process secondary consideration. Narrowing proximal superior mesenteric artery with distal branch occlusion. Narrowing origin inferior mesenteric artery. Narrowing proximal celiac artery. Aortic Atherosclerosis (ICD10-I70.0). Small left-sided pleural effusion. Possible increased right heart pressures indicated by prominent hepatic veins. Delayed  excretion of contrast into kidneys may indicate poor renal function. Scoliosis lumbar spine with superimposed degenerative changes. These results were called by telephone at the time of interpretation on 02/15/2018 at 7:13 am to Dr. Darel Hong , who verbally acknowledged these results. Electronically Signed   By: Genia Del M.D.   On: 02/03/2018 07:22   Dg Chest Port 1 View  Result Date: 01/22/2018 CLINICAL DATA:  Weakness and shortness of breath. EXAM: PORTABLE CHEST 1 VIEW COMPARISON:  Chest radiograph 07/01/2013. Included portion from abdominal CT obtained same day. FINDINGS: Heart is enlarged. Atherosclerosis of the aortic arch. Left pleural effusion basilar atelectasis. No focal airspace disease in the right lung. No pulmonary edema. No pneumothorax. No acute osseous abnormalities. IMPRESSION: Cardiomegaly. Small left pleural effusion with adjacent atelectasis. Aortic Atherosclerosis (ICD10-I70.0). Electronically Signed   By: Jeb Levering M.D.   On: 02/20/2018 06:58    Anti-infectives: Anti-infectives (From admission, onward)   Start     Dose/Rate Route Frequency Ordered Stop   02/14/2018 1400  piperacillin-tazobactam (ZOSYN) IVPB 3.375 g  Status:  Discontinued     3.375 g 12.5 mL/hr over 240 Minutes Intravenous Every 8 hours 02/01/2018 0919 02-22-18 1025   02/14/2018 0700  piperacillin-tazobactam (ZOSYN) IVPB 3.375 g     3.375 g 100 mL/hr over 30 Minutes Intravenous  Once 02/19/2018 0647 01/22/2018 0744      Assessment/Plan:  Labs reviewed.  Bicarb remains normal.  This patient is at risk for patchy or partial ischemic necrosis versus complete ischemic necrosis but has had revascularization with some success.  I discussed again with the patient's daughter about DNR/DNI and the choice to proceed with surgery if it were to become necessary.  She is adamant that the patient should not have any surgical intervention.  They have talked about palliative care and comfort care.  Daughter is happy  with that decision and does not want to proceed with surgery.  With that in mind I will sign off but be available as needed.  Florene Glen, MD, FACS  Feb 22, 2018

## 2018-02-21 NOTE — Consult Note (Signed)
Consultation Note Date: 2018-01-23   Patient Name: Rachel Knox  DOB: 1928/02/02  MRN: [id number]  Age / Sex: 84 y.o., female  PCP: Tower, Audrie GallusMarne A, MD Referring Physician: Katha HammingKonidena, Snehalatha, MD  Reason for Consultation: Establishing goals of care and Psychosocial/spiritual support  HPI/Patient Profile: 84 y.o. female  admitted on 02/09/2018 with past medical  history of thyroidism, hypertension brought in by family for dehydration, 2/2 to several  weeks of  decreased p.o. intake, patient mainly in the bed for last 2 weeks.  Patient complained of severe abdominal pain  with multiple episodes of vomiting.  Patient noted to have severe sepsis with elevated lactic acid up to 4, CT abdomen, pelvis with contrast showed diffuse colonic wall thickening, enterocolitis, possible ischemic colitis, narrowing of mesenteric artery and proximal region, narrowing of proximal celiac artery.  Stent placed In her SMA yesterday, continues with severe pain and nausea.  Family is faced with treatment option decisions, advanced directive decisions as it relates to EOL care.    Clinical Assessment and Goals of Care:  This NP Lorinda CreedMary Tryphena Perkovich reviewed medical records, received report from team, assessed the patient and then meet at the patient's bedside along with her daughter and son  to discuss diagnosis, prognosis, GOC, EOL wishes disposition and options.  Concept of Hospice and Palliative Care were discussed  A detailed discussion was had today regarding advanced directives.  Concepts specific to code status, artifical feeding and hydration, continued IV antibiotics and rehospitalization was had.  The difference between a aggressive medical intervention path  and a palliative comfort care path for this patient at this time was had.  Values and goals of care important to patient and family were attempted to be  elicited.  Natural trajectory and expectations at EOL were discussed.  Questions and concerns addressed.   Family encouraged to call with questions or concerns.    PMT will continue to support holistically.    SUMMARY OF RECOMMENDATIONS    Family verbalize their understanding that the patient would not want  aggressive interventions, she has voiced her readiness to die and acceptance of her mortality and they believe that comfort and dignity are priority at this point in time.   Code Status/Advance Care Planning:  DNR    Symptom Management:   Pain/Dyspnea: Morphine 1 mg every 1 hr prn  Agitation:  Ativan 1 mg IV every 4 hrs prn  Palliative Prophylaxis:   Frequent Pain Assessment and Oral Care  Additional Recommendations (Limitations, Scope, Preferences):  Full Comfort Care  Psycho-social/Spiritual:   Desire for further Chaplaincy support:no- declined  Additional Recommendations: Grief/Bereavement Support  Prognosis:   Hours - Days  Discharge Planning: Anticipated Hospital Death      Primary Diagnoses: Present on Admission: . Ischemic bowel syndrome (HCC)   I have reviewed the medical record, interviewed the patient and family, and examined the patient. The following aspects are pertinent.  Past Medical History:  Diagnosis Date  . Diverticulosis of colon   . Hyperlipidemia   . Hypertension   .  IBS (irritable bowel syndrome)   . Osteoarthritis   . Urinary incontinence    Social History   Socioeconomic History  . Marital status: Divorced    Spouse name: Not on file  . Number of children: Not on file  . Years of education: Not on file  . Highest education level: Not on file  Occupational History  . Not on file  Social Needs  . Financial resource strain: Not on file  . Food insecurity:    Worry: Not on file    Inability: Not on file  . Transportation needs:    Medical: Not on file    Non-medical: Not on file  Tobacco Use  . Smoking status:  Former Games developer  . Smokeless tobacco: Never Used  Substance and Sexual Activity  . Alcohol use: No    Alcohol/week: 0.0 oz  . Drug use: No  . Sexual activity: Not on file  Lifestyle  . Physical activity:    Days per week: Not on file    Minutes per session: Not on file  . Stress: Not on file  Relationships  . Social connections:    Talks on phone: Not on file    Gets together: Not on file    Attends religious service: Not on file    Active member of club or organization: Not on file    Attends meetings of clubs or organizations: Not on file    Relationship status: Not on file  Other Topics Concern  . Not on file  Social History Narrative  . Not on file   Family History  Problem Relation Age of Onset  . Cancer Mother        kidney cancer  . Heart disease Father        MI  . Cancer Sister        lung  . Heart disease Sister        MI   Scheduled Meds: . mouth rinse  15 mL Mouth Rinse BID  . sodium chloride flush  3 mL Intravenous Q12H   Continuous Infusions: . sodium chloride    . heparin 750 Units/hr (02-20-18 0600)  . lactated ringers with kcl 100 mL/hr at Feb 20, 2018 0600  . piperacillin-tazobactam (ZOSYN)  IV 3.375 g (2018/02/20 0546)   PRN Meds:.sodium chloride, acetaminophen **OR** acetaminophen, bisacodyl, morphine injection, ondansetron **OR** ondansetron (ZOFRAN) IV, sodium chloride flush Medications Prior to Admission:  Prior to Admission medications   Medication Sig Start Date End Date Taking? Authorizing Provider  levothyroxine (SYNTHROID, LEVOTHROID) 50 MCG tablet Take 1 tablet (50 mcg total) by mouth daily. 09/10/17  Yes Tower, Audrie Gallus, MD  losartan-hydrochlorothiazide (HYZAAR) 100-12.5 MG tablet Take 1 tablet by mouth daily. 09/10/17  Yes Tower, Audrie Gallus, MD  traMADol (ULTRAM) 50 MG tablet TAKE 1 TO 2 TABLETS BY MOUTH EVERY 8 HOURS AS NEEDED FOR PAIN Patient not taking: Reported on 02/14/2018 08/13/17   Judy Pimple, MD   Allergies  Allergen Reactions  .  Amlodipine Besylate     REACTION: swelling  . Aricept [Donepezil Hcl] Diarrhea  . Atorvastatin     REACTION: unspecified  . Azithromycin     REACTION: nausea and vomiting  . Clarithromycin     REACTION: severe diarrhea  . Ibuprofen     REACTION: unspecified  . Levofloxacin     REACTION: diarrhea  . Piroxicam     REACTION: unspecified  . Simvastatin     Cramps in her feet  . Metoprolol  Asymptomatic but severe bradycardia   Review of Systems  Unable to perform ROS: Acuity of condition    Physical Exam  Constitutional: She appears well-developed. She appears lethargic. She appears ill. Nasal cannula in place.  - rolling in bed and moaning  Cardiovascular: Tachycardia present.  Pulmonary/Chest: She has decreased breath sounds.  Abdominal: Bowel sounds are decreased. There is tenderness.  Neurological: She appears lethargic.  Skin: Skin is warm and dry.  - color is gray  - right foot is mottled and cool     Vital Signs: BP (!) 93/50   Pulse (!) 102   Temp (!) 97.5 F (36.4 C) (Rectal)   Resp 14   Ht 5\' 5"  (1.651 m)   Wt 52.8 kg (116 lb 8 oz)   LMP 07/24/1980   SpO2 98%   BMI 19.39 kg/m  Pain Scale: 0-10   Pain Score: 0-No pain   SpO2: SpO2: 98 % O2 Device:SpO2: 98 % O2 Flow Rate: .O2 Flow Rate (L/min): 3 L/min  IO: Intake/output summary:   Intake/Output Summary (Last 24 hours) at 2018-02-14 0733 Last data filed at 02/14/18 0600 Gross per 24 hour  Intake 1540.7 ml  Output 375 ml  Net 1165.7 ml    LBM:   Baseline Weight: Weight: 52.8 kg (116 lb 8 oz) Most recent weight: Weight: (unable to obtain accurate weight on patient)      Palliative Assessment/Data: 20 % at best   Discussed with Dr Belia Heman and Dr Luberta Mutter  Time In: 0930 Time Out: 1045 Time Total: 75 minutes Greater than 50%  of this time was spent counseling and coordinating care related to the above assessment and plan.  Signed by: Lorinda Creed, NP   Please contact Palliative  Medicine Team phone at [phone] for questions and concerns.  For individual provider: See Loretha Stapler

## 2018-02-21 NOTE — Death Summary Note (Signed)
    Death Note please see Last Note for all details.   In breif - 84 year old female patient who is DNR/DNI  with history of hypertension, hypothyroidism admitted on July 9 for severe abdominal pain, nausea and found to have severe mesenteric ischemia associated with ARDS, admitted to intensive care unit,, . Give aggressive hydration, oxygen support, your IV antibiotics, seen by vascular, she had a. Mesenteric artery stent and placed. After that patient continues to decline the decreased responsiveness, dry heaving, patient daughter whose health care power of attorney wanted full comfort measures, started on morphine, Ativan, seen by palliative care  nurse, peacefully expired at 19,40 PM.   Rachel Knox CSN:[id number],MRN:[id number] is a 84 y.o. female, Outpatient Primary MD for the patient is Tower, Audrie GallusMarne A, MD  Pronounced dead by RN on 10-15-17    @     19:40 PM              Cause of death  small bowel ischemia ARDS Possible  intra-abdominal steps acute renal failure  Total clinical and documentation time for today Under 30 minutes   Last Note 84 year old history of sudden onset of abdominal pain, and found have atrial fibrillation, mesenteric ischemia status SMA stent, and heparin drip because of advanced age., Encephalopathy, worsening kidney function family decided to pursue comfort measures. Patient started on full comfort measures. New morphine,, Ativan  D/w daughter,palliative care.

## 2018-02-21 DEATH — deceased

## 2020-05-13 IMAGING — CT CT HEAD W/O CM
3 series · 16 of 47 positions shown, 19 images · non-contrast
Comparison: None.

CLINICAL DATA: 89-year-old female with generalized weakness.
Initial encounter.

EXAM:
CT HEAD WITHOUT CONTRAST
TECHNIQUE: Contiguous axial images were obtained from the base of the skull
through the vertex without intravenous contrast.

[Series 2: head wo · axial · 0.42mm/px · z∈[-111,+14]mm · 10 of 31 slices shown, 13 images]
[im 3/31  brain]
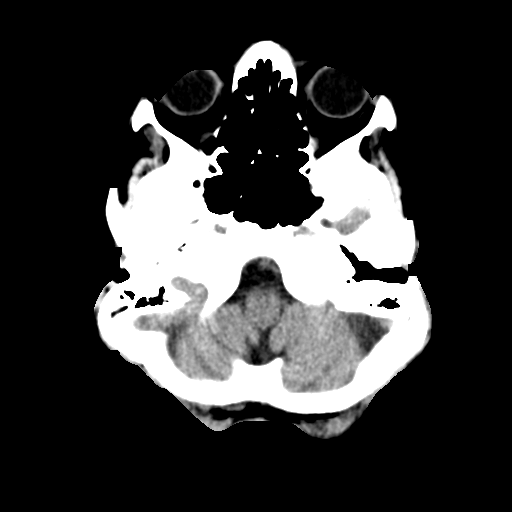
[im 3/31  bone]
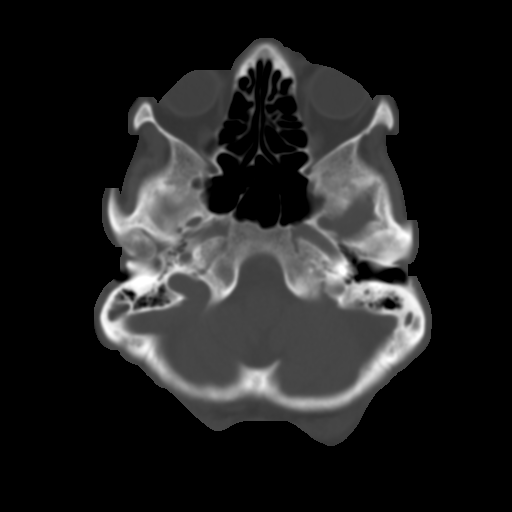
[im 6/31  brain]
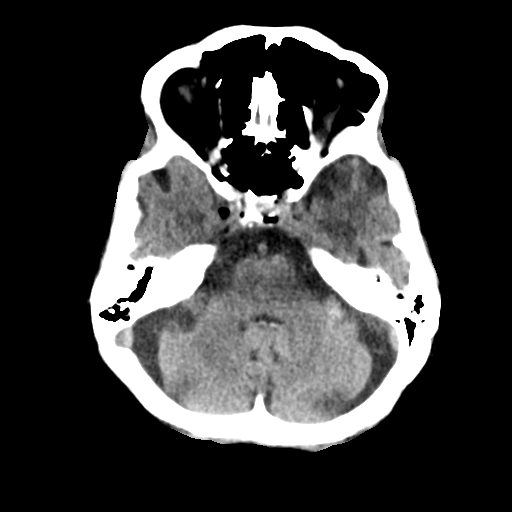
[im 9/31  brain]
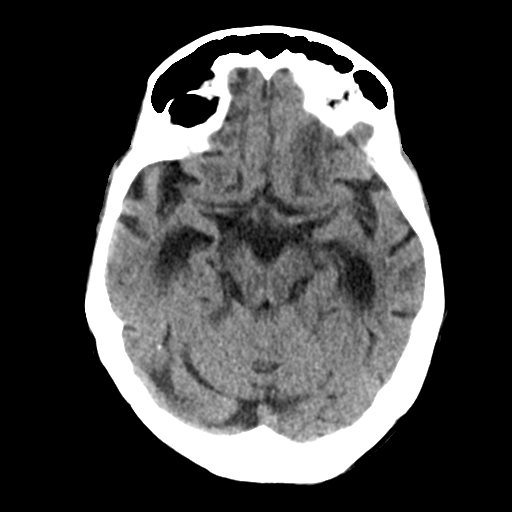
[im 11/31  brain]
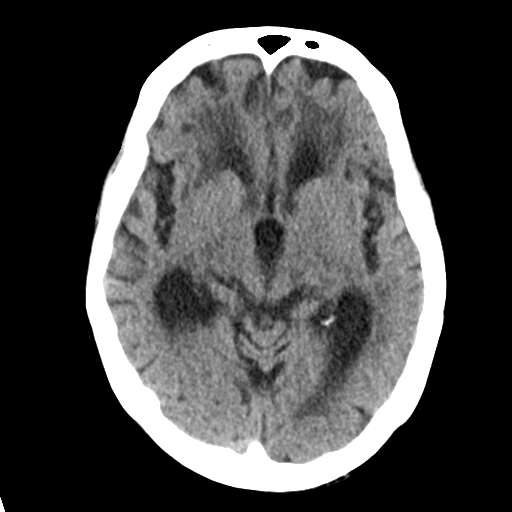
[im 14/31  brain]
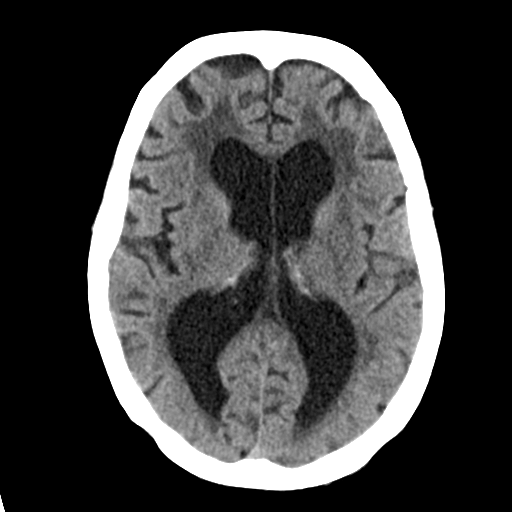
[im 14/31  bone]
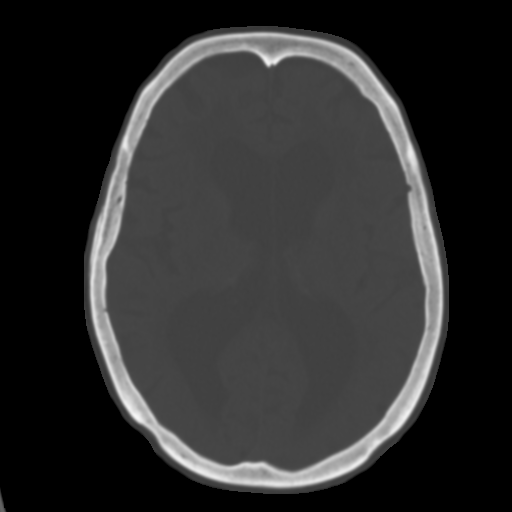
[im 17/31  brain]
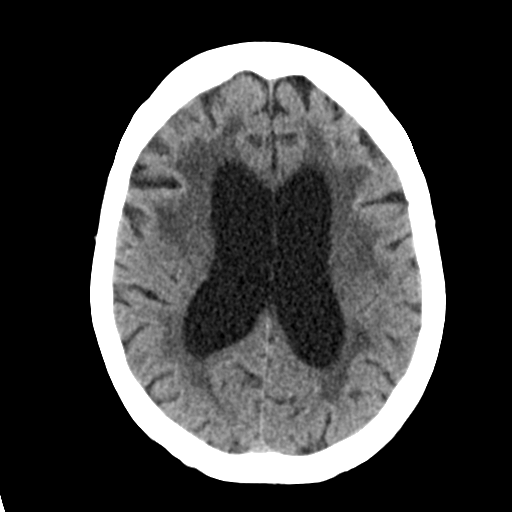
[im 20/31  brain]
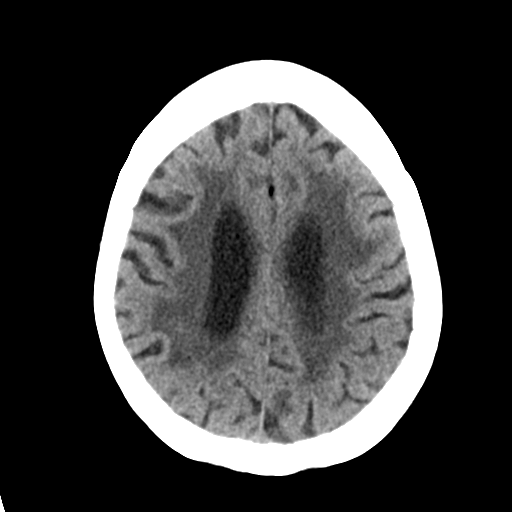
[im 23/31  brain]
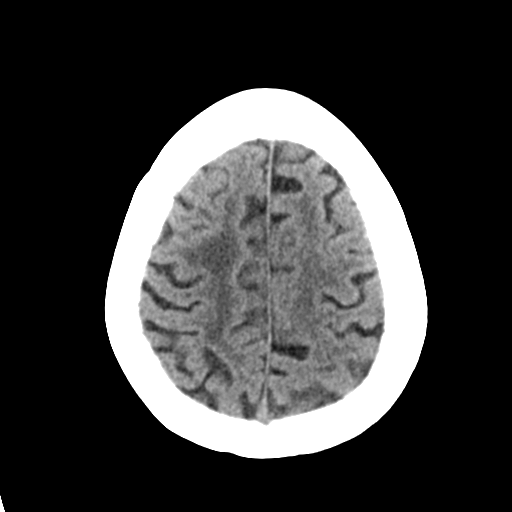
[im 25/31  brain]
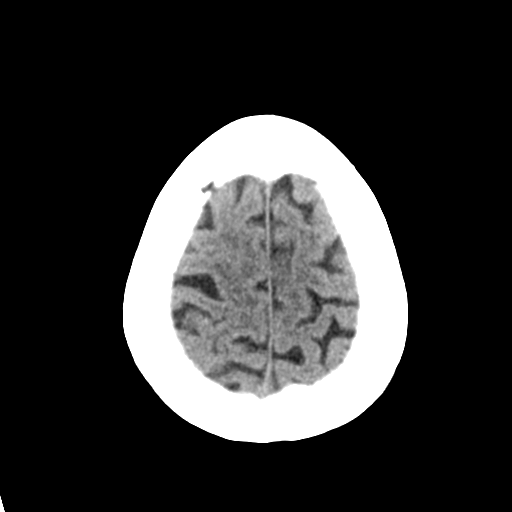
[im 25/31  bone]
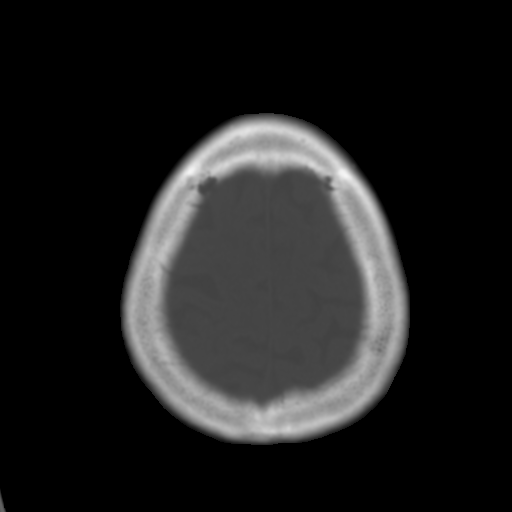
[im 28/31  brain]
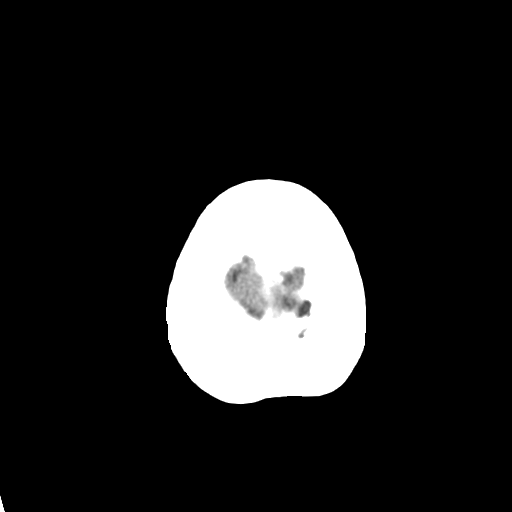

[Series 4: coronal soft tissue · coronal · 0.30mm/px · 3 of 67 slices shown]
[im 23/67  brain]
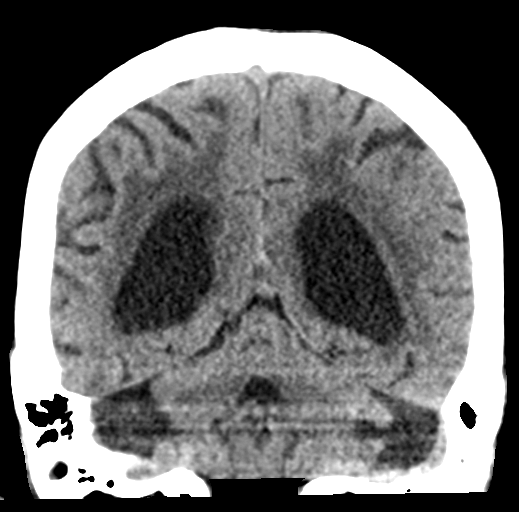
[im 30/67  brain]
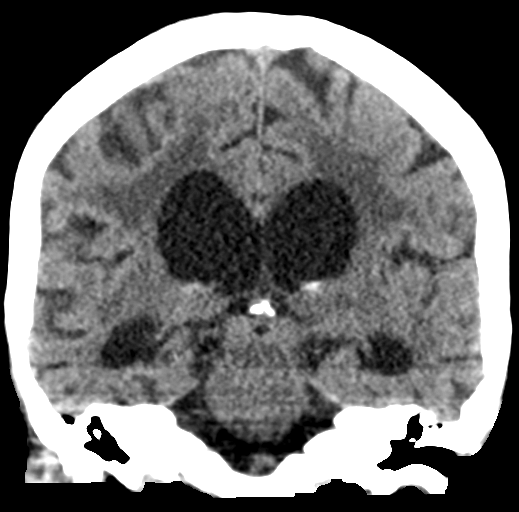
[im 37/67  brain]
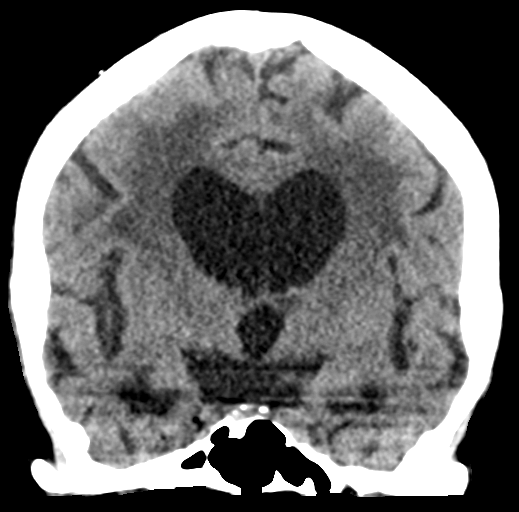

[Series 5: sagittal soft tissue · sagittal · 0.30mm/px · 3 of 52 slices shown]
[im 18/52  brain]
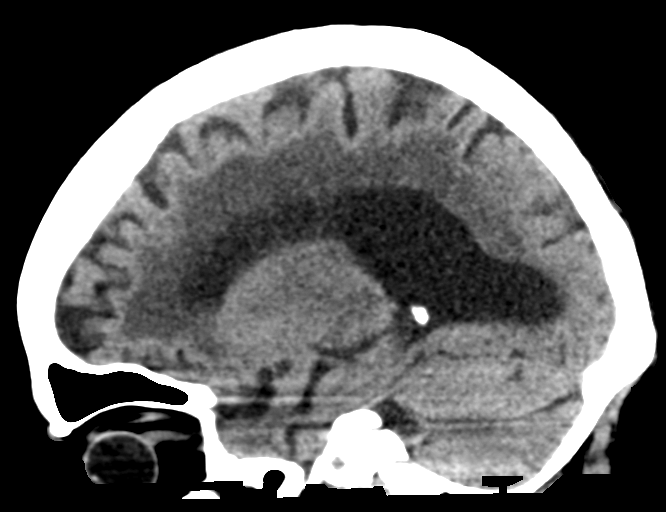
[im 26/52  brain]
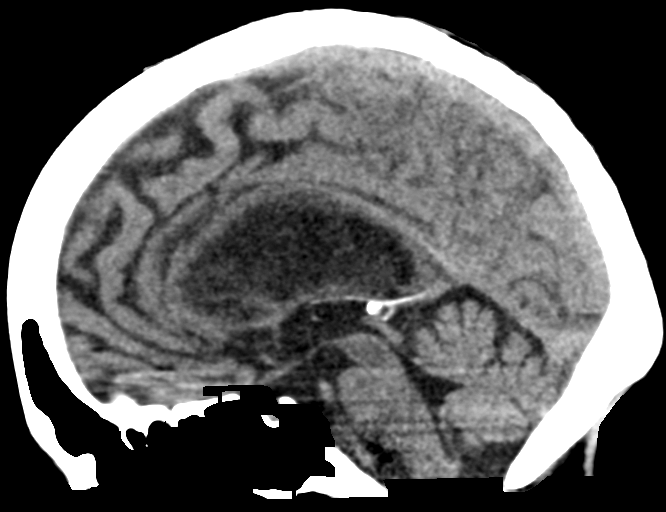
[im 35/52  brain]
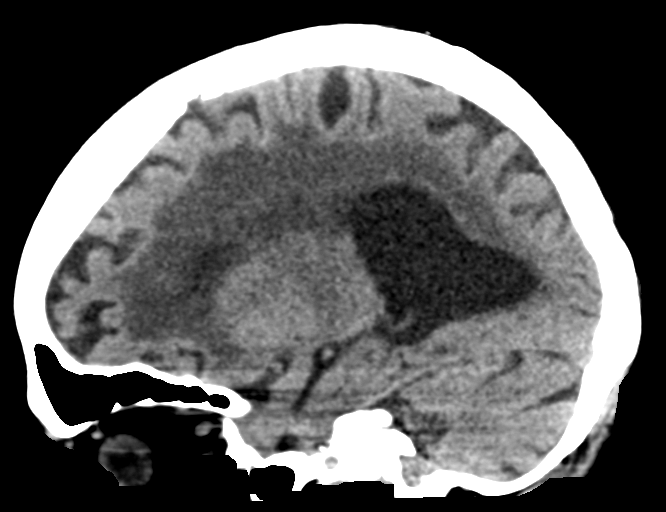

[16 of 47 positions shown; findings below may reference images not displayed]

FINDINGS: Brain: No intracranial hemorrhage.

Left thalamic infarct of indeterminate age.

Prominent chronic microvascular changes.

Moderate global atrophy. Ventricular prominence probably related to
atrophy rather than hydrocephalus. Patent aqueduct.

No intracranial mass lesion noted on this unenhanced exam.

Vascular: Vascular calcifications.

Skull: Negative

Sinuses/Orbits: No acute orbital abnormality. Visualized paranasal
sinuses clear.

Other: Minimal partial opacification mastoid air cells. Middle ear
cavities clear.
IMPRESSION: Left thalamic infarct of indeterminate age.

Prominent chronic microvascular changes.

No intracranial hemorrhage.

Global atrophy.
